# Patient Record
Sex: Female | Born: 1945 | Race: White | Hispanic: No | Marital: Married | Smoking: Never smoker
Health system: Southern US, Community
[De-identification: ages and names within clinical notes are randomized; demographics above are authoritative.]

## PROBLEM LIST (undated history)

## (undated) DIAGNOSIS — Z9889 Other specified postprocedural states: Secondary | ICD-10-CM

## (undated) DIAGNOSIS — R112 Nausea with vomiting, unspecified: Secondary | ICD-10-CM

## (undated) DIAGNOSIS — E785 Hyperlipidemia, unspecified: Secondary | ICD-10-CM

## (undated) DIAGNOSIS — I10 Essential (primary) hypertension: Secondary | ICD-10-CM

## (undated) DIAGNOSIS — K759 Inflammatory liver disease, unspecified: Secondary | ICD-10-CM

## (undated) DIAGNOSIS — Z5189 Encounter for other specified aftercare: Secondary | ICD-10-CM

## (undated) DIAGNOSIS — M1990 Unspecified osteoarthritis, unspecified site: Secondary | ICD-10-CM

## (undated) DIAGNOSIS — IMO0001 Reserved for inherently not codable concepts without codable children: Secondary | ICD-10-CM

## (undated) HISTORY — PX: KNEE ARTHROSCOPY: SUR90

## (undated) HISTORY — PX: COLONOSCOPY: SHX174

## (undated) HISTORY — PX: JOINT REPLACEMENT: SHX530

## (undated) HISTORY — PX: TONSILLECTOMY: SUR1361

## (undated) HISTORY — PX: RECTAL SURGERY: SHX760

---

## 1972-04-19 HISTORY — PX: APPENDECTOMY: SHX54

## 1993-04-19 HISTORY — PX: ABDOMINAL HYSTERECTOMY: SHX81

## 1994-04-19 HISTORY — PX: CHOLECYSTECTOMY: SHX55

## 2003-09-25 ENCOUNTER — Inpatient Hospital Stay (HOSPITAL_COMMUNITY): Admission: RE | Admit: 2003-09-25 | Discharge: 2003-09-29 | Payer: Self-pay | Admitting: Orthopedic Surgery

## 2004-08-19 ENCOUNTER — Inpatient Hospital Stay (HOSPITAL_COMMUNITY): Admission: RE | Admit: 2004-08-19 | Discharge: 2004-08-22 | Payer: Self-pay | Admitting: Orthopedic Surgery

## 2010-04-19 DIAGNOSIS — R413 Other amnesia: Secondary | ICD-10-CM

## 2010-04-19 HISTORY — DX: Other amnesia: R41.3

## 2011-05-21 ENCOUNTER — Other Ambulatory Visit (HOSPITAL_COMMUNITY): Payer: Self-pay | Admitting: Orthopaedic Surgery

## 2011-05-21 DIAGNOSIS — M25562 Pain in left knee: Secondary | ICD-10-CM

## 2011-06-01 ENCOUNTER — Encounter (HOSPITAL_COMMUNITY)
Admission: RE | Admit: 2011-06-01 | Discharge: 2011-06-01 | Disposition: A | Discharge status: Home / Self Care | Payer: BC Managed Care – PPO | Source: Ambulatory Visit | Attending: Orthopaedic Surgery | Admitting: Orthopaedic Surgery

## 2011-06-01 ENCOUNTER — Encounter (HOSPITAL_COMMUNITY): Payer: Self-pay

## 2011-06-01 DIAGNOSIS — M25562 Pain in left knee: Secondary | ICD-10-CM

## 2011-06-01 DIAGNOSIS — M25569 Pain in unspecified knee: Secondary | ICD-10-CM | POA: Insufficient documentation

## 2011-06-01 DIAGNOSIS — Z96659 Presence of unspecified artificial knee joint: Secondary | ICD-10-CM | POA: Insufficient documentation

## 2011-06-01 MED ORDER — TECHNETIUM TC 99M MEDRONATE IV KIT
25.0000 | PACK | Freq: Once | INTRAVENOUS | Status: AC | PRN
  Administered 2011-06-01: 25 via INTRAVENOUS

## 2011-06-08 ENCOUNTER — Encounter (HOSPITAL_COMMUNITY): Payer: Self-pay | Admitting: Pharmacy Technician

## 2011-06-11 ENCOUNTER — Encounter (HOSPITAL_COMMUNITY): Payer: Self-pay

## 2011-06-11 ENCOUNTER — Ambulatory Visit (HOSPITAL_COMMUNITY)
Admission: RE | Admit: 2011-06-11 | Discharge: 2011-06-11 | Disposition: A | Discharge status: Home / Self Care | Payer: BC Managed Care – PPO | Source: Ambulatory Visit | Attending: Orthopedic Surgery | Admitting: Orthopedic Surgery

## 2011-06-11 ENCOUNTER — Encounter (HOSPITAL_COMMUNITY)
Admission: RE | Admit: 2011-06-11 | Discharge: 2011-06-11 | Disposition: A | Discharge status: Home / Self Care | Payer: BC Managed Care – PPO | Source: Ambulatory Visit | Attending: Orthopaedic Surgery | Admitting: Orthopaedic Surgery

## 2011-06-11 ENCOUNTER — Other Ambulatory Visit: Payer: Self-pay

## 2011-06-11 DIAGNOSIS — Z01810 Encounter for preprocedural cardiovascular examination: Secondary | ICD-10-CM | POA: Insufficient documentation

## 2011-06-11 DIAGNOSIS — Z01812 Encounter for preprocedural laboratory examination: Secondary | ICD-10-CM | POA: Insufficient documentation

## 2011-06-11 DIAGNOSIS — E119 Type 2 diabetes mellitus without complications: Secondary | ICD-10-CM | POA: Insufficient documentation

## 2011-06-11 DIAGNOSIS — Z01818 Encounter for other preprocedural examination: Secondary | ICD-10-CM | POA: Insufficient documentation

## 2011-06-11 DIAGNOSIS — I10 Essential (primary) hypertension: Secondary | ICD-10-CM | POA: Insufficient documentation

## 2011-06-11 HISTORY — DX: Nausea with vomiting, unspecified: R11.2

## 2011-06-11 HISTORY — DX: Other specified postprocedural states: Z98.890

## 2011-06-11 HISTORY — DX: Essential (primary) hypertension: I10

## 2011-06-11 HISTORY — DX: Hyperlipidemia, unspecified: E78.5

## 2011-06-11 LAB — URINE MICROSCOPIC-ADD ON

## 2011-06-11 LAB — DIFFERENTIAL
Basophils Absolute: 0 10*3/uL (ref 0.0–0.1)
Lymphocytes Relative: 18 % (ref 12–46)
Lymphs Abs: 1.2 10*3/uL (ref 0.7–4.0)
Monocytes Absolute: 0.6 10*3/uL (ref 0.1–1.0)
Neutro Abs: 5 10*3/uL (ref 1.7–7.7)

## 2011-06-11 LAB — URINALYSIS, ROUTINE W REFLEX MICROSCOPIC
Glucose, UA: NEGATIVE mg/dL
Hgb urine dipstick: NEGATIVE
Ketones, ur: NEGATIVE mg/dL
Protein, ur: NEGATIVE mg/dL

## 2011-06-11 LAB — CBC
MCH: 32 pg (ref 26.0–34.0)
MCHC: 34.4 g/dL (ref 30.0–36.0)
MCV: 93 fL (ref 78.0–100.0)
Platelets: 254 10*3/uL (ref 150–400)
RBC: 3.59 MIL/uL — ABNORMAL LOW (ref 3.87–5.11)
RDW: 13.6 % (ref 11.5–15.5)

## 2011-06-11 LAB — COMPREHENSIVE METABOLIC PANEL
AST: 21 U/L (ref 0–37)
CO2: 29 mEq/L (ref 19–32)
Calcium: 9.8 mg/dL (ref 8.4–10.5)
Creatinine, Ser: 0.9 mg/dL (ref 0.50–1.10)
GFR calc non Af Amer: 65 mL/min — ABNORMAL LOW (ref >90)
Sodium: 138 mEq/L (ref 135–145)
Total Protein: 7.6 g/dL (ref 6.0–8.3)

## 2011-06-11 LAB — PROTIME-INR: Prothrombin Time: 14.3 seconds (ref 11.6–15.2)

## 2011-06-11 MED ORDER — CHLORHEXIDINE GLUCONATE 4 % EX LIQD: 60.0000 mL | Freq: Every day | CUTANEOUS | Status: DC

## 2011-06-11 MED ORDER — CHLORHEXIDINE GLUCONATE 4 % EX LIQD: 60.0000 mL | Freq: Once | CUTANEOUS | Status: DC

## 2011-06-11 NOTE — Pre-Procedure Instructions (Signed)
20 Mabel Roll  06/11/2011   Your procedure is scheduled on:  June 22, 2011 (Tuesday)  Report to [facility] at 5:30 AM.  Call this number if you have problems the morning of surgery: [phone]   Remember:   Do not eat food:After Midnight.  May have clear liquids: up to 4 Hours before arrival.  Clear liquids include soda, tea, black coffee, apple or grape juice, broth.  Take these medicines the morning of surgery with A SIP OF WATER: bystolic, ultram   Do not wear jewelry, make-up or nail polish.  Do not wear lotions, powders, or perfumes. You may wear deodorant.  Do not shave 48 hours prior to surgery.  Do not bring valuables to the hospital.  Contacts, dentures or bridgework may not be worn into surgery.  Leave suitcase in the car. After surgery it may be brought to your room.  For patients admitted to the hospital, checkout time is 11:00 AM the day of discharge.   Patients discharged the day of surgery will not be allowed to drive home.  Name and phone number of your driver: NA  Special Instructions: Incentive Spirometry - Practice and bring it with you on the day of surgery. and CHG Shower Use Special Wash: 1/2 bottle night before surgery and 1/2 bottle morning of surgery.   Please read over the following fact sheets that you were given: Pain Booklet, Blood Transfusion Information, Total Joint Packet, MRSA Information and Surgical Site Infection Prevention

## 2011-06-12 LAB — URINE CULTURE
Colony Count: NO GROWTH
Culture: NO GROWTH

## 2011-06-14 NOTE — Progress Notes (Signed)
Anesthesia  C-met noted Alk Phosphatase 145. OK to proceed.  Kipp Brood

## 2011-06-14 NOTE — Progress Notes (Signed)
Elevated ALK Phos on CMET- will make anesth. Aware.

## 2011-06-21 MED ORDER — SODIUM CHLORIDE 0.9 % IV SOLN: INTRAVENOUS | Status: DC

## 2011-06-21 NOTE — H&P (Signed)
[name] MRN:  [id number] DOB/SEX:  22-Sep-1945/female  CHIEF COMPLAINT:  Painful left Knee  HISTORY: [name] is a very pleasant 66 year old white female who is seen back today for follow up of her left knee pain.  She had a left total knee arthroplasty performed by Dr. Priscille Kluver in 2005.  She states she has had pain off and on in the left knee since her surgery, but this began to exacerbate in January of this year.  She has gotten to the point now where her pain is so severe and sharp that she is having difficulties tolerating the pain.  It was of insidious onset recently such that caused her even to leave work.  She went home and rested the knee and finally had improvement initially.  When she is up and active the pain worsens.  When she is at rest the pain is tolerable.  She denies any recent history of injury or trauma to the knee.  She has been using Advil intermittently throughout the day.  She denies any fevers, shakes, or chills.  Rest has been helpful, but activity again worsens her symptoms.  We have sent her off for a bone scan as well as laboratory studies and an aspiration was performed and revealed:  LABS:    1. Laboratory studies of the blood reveals a white count of 7,400.  She does have some anemia with hemoglobin of 11.2.  Sedimentation rate was 30.  CRP was .74.  2. Aspiration revealed yellow, cloudy fluid with 660 white cells, 38 neutrophils, 54 lymphocytes, 7 monocytes, 1 eosinophil.  There were no crystals noted.  Glucose was 82.  Protein was 4.1.    3. Cultures revealed no growth x 3 days.    BONE SCAN:  Three phase bone scan reveals asymmetric increased uptake around the left knee prosthesis that could be due to infection or loosening.  This was around all three components.   She has failed conservative treatment and because of worsening pain is indicated for Removal of prosthesis and revision vs antibiotic spacer implantation staging.   PAST MEDICAL HISTORY: There are no  active problems to display for this patient.  Past Medical History  Diagnosis Date  . PONV (postoperative nausea and vomiting)     scop patch before prior surgeries  . Hypertension   . Hyperlipemia    Past Surgical History  Procedure Date  . Appendectomy 1974  . Abdominal hysterectomy 1995  . Cholecystectomy 1996  . Joint replacement     lft knee 2005, rt knee 2006  . Colonoscopy      MEDICATIONS:   No prescriptions prior to admission    ALLERGIES:  No Known Allergies  REVIEW OF SYSTEMS:  A comprehensive review of systems was negative.   FAMILY HISTORY:  No family history on file.  SOCIAL HISTORY:   History  Substance Use Topics  . Smoking status: Never Smoker   . Smokeless tobacco: Not on file  . Alcohol Use: No     EXAMINATION:  Vital signs in last 24 hours:    T - 98.2,  P-63,  R-18,  BP- 132/85, HT-5'6"  WT 200  BMI- 32.3  General Appearance:    Alert, cooperative, no distress, appears stated age  Head:    Normocephalic, without obvious abnormality, atraumatic  Eyes:    PERRL, conjunctiva/corneas clear  Ears:    Normal TM's and external ear canals, both ears  Nose:   Nares normal, septum midline, mucosa normal, no drainage  or sinus tenderness  Throat:   Lips, mucosa, and tongue normal; teeth and gums normal  Neck:   Supple, symmetrical, trachea midline  Back:     Symmetric, no curvature, ROM normal, no CVA tenderness  Lungs:     Clear to auscultation bilaterally, respirations unlabored  Chest Wall:    No tenderness or deformity   Heart:    Regular rate and rhythm, S1 and S2 normal, no murmur, rub   or gallop  Breast Exam:    Not indicated for Orthopaedic exam  Abdomen:     Soft, non-tender, bowel sounds active all four quadrants,    no masses, no organomegaly  Genitalia:    Not indicated for Orthopaedic exam  Rectal:    Not indicated for Orthopaedic exam   Extremities:   Extremities normal, atraumatic, no cyanosis or edema  Pulses:   2+ and  symmetric all extremities  Skin:   Skin color, texture, turgor normal, no rashes or lesions  Lymph nodes:   Cervical, supraclavicular, and axillary nodes normal  Neurologic:   CNII-XII intact grossly intact, Ox3       MUSCULOSKELETAL:  she has range of motion from 10 degrees to 90 degrees.  She appears to                                               have good stability at this time.  There is no warmth or erythema.  She                                                     does have a mild effusion noted.  She has good motion of the hips                                                           bilaterally.  She is neurovascularly intact distally.  A well-healed surgical                                                   incision is noted.      Imaging Review         See HPI   Assessment/Plan:  Painful Left Total Knee Replacement    Past Medical History  Diagnosis Date  . PONV (postoperative nausea and vomiting)     scop patch before prior surgeries  . Hypertension   . Hyperlipemia     Plan for left total knee revision vs antibiotic space  The patient has failed conservative treatment.  The clearance notes were reviewed.  After discussion with the patient it was felt that RevisionTotal Knee Replacement vs Antibiotic Spacer was indicated. The procedure,  risks, and benefits of Revision total knee arthroplasty were presented and reviewed. The risks including but not limited to aseptic loosening, infection, blood clots, vascular injury, stiffness, patella tracking problems and fracture complications among others  were discussed. The patient acknowledged the explanation, agreed to proceed with the plan.  [name] 06/21/2011, 6:24 PM

## 2011-06-22 ENCOUNTER — Encounter (HOSPITAL_COMMUNITY): Payer: Self-pay | Admitting: Anesthesiology

## 2011-06-22 ENCOUNTER — Encounter (HOSPITAL_COMMUNITY)
Admission: RE | Disposition: A | Discharge status: Home / Self Care | Payer: Self-pay | Source: Ambulatory Visit | Attending: Orthopaedic Surgery

## 2011-06-22 ENCOUNTER — Encounter (HOSPITAL_COMMUNITY): Payer: Self-pay | Admitting: *Deleted

## 2011-06-22 ENCOUNTER — Ambulatory Visit (HOSPITAL_COMMUNITY): Payer: BC Managed Care – PPO | Admitting: Anesthesiology

## 2011-06-22 DIAGNOSIS — I10 Essential (primary) hypertension: Secondary | ICD-10-CM | POA: Diagnosis present

## 2011-06-22 DIAGNOSIS — R112 Nausea with vomiting, unspecified: Secondary | ICD-10-CM | POA: Diagnosis not present

## 2011-06-22 DIAGNOSIS — Z96659 Presence of unspecified artificial knee joint: Secondary | ICD-10-CM

## 2011-06-22 DIAGNOSIS — E669 Obesity, unspecified: Secondary | ICD-10-CM | POA: Diagnosis present

## 2011-06-22 DIAGNOSIS — Z01812 Encounter for preprocedural laboratory examination: Secondary | ICD-10-CM

## 2011-06-22 DIAGNOSIS — T8450XA Infection and inflammatory reaction due to unspecified internal joint prosthesis, initial encounter: Principal | ICD-10-CM | POA: Diagnosis present

## 2011-06-22 DIAGNOSIS — D649 Anemia, unspecified: Secondary | ICD-10-CM | POA: Diagnosis present

## 2011-06-22 DIAGNOSIS — Z90710 Acquired absence of both cervix and uterus: Secondary | ICD-10-CM

## 2011-06-22 DIAGNOSIS — D62 Acute posthemorrhagic anemia: Secondary | ICD-10-CM | POA: Diagnosis not present

## 2011-06-22 DIAGNOSIS — E785 Hyperlipidemia, unspecified: Secondary | ICD-10-CM | POA: Diagnosis present

## 2011-06-22 DIAGNOSIS — D638 Anemia in other chronic diseases classified elsewhere: Secondary | ICD-10-CM | POA: Diagnosis present

## 2011-06-22 DIAGNOSIS — Z6831 Body mass index (BMI) 31.0-31.9, adult: Secondary | ICD-10-CM

## 2011-06-22 DIAGNOSIS — T84099A Other mechanical complication of unspecified internal joint prosthesis, initial encounter: Secondary | ICD-10-CM | POA: Diagnosis present

## 2011-06-22 DIAGNOSIS — T8484XA Pain due to internal orthopedic prosthetic devices, implants and grafts, initial encounter: Secondary | ICD-10-CM

## 2011-06-22 LAB — GRAM STAIN

## 2011-06-22 SURGERY — REMOVAL, TOTAL ARTHROPLASTY HARDWARE, KNEE, WITH ANTIBIOTIC SPACER INSERTION
Anesthesia: General | Site: Knee | Laterality: Left | Wound class: Clean

## 2011-06-22 MED ORDER — FOLIC ACID 1 MG PO TABS
1.0000 mg | ORAL_TABLET | Freq: Every day | ORAL | Status: DC
  Administered 2011-06-22 – 2011-06-24 (×3): 1 mg via ORAL
  Filled 2011-06-22 (×4): qty 1

## 2011-06-22 MED ORDER — METHOCARBAMOL 100 MG/ML IJ SOLN
500.0000 mg | INTRAVENOUS | Status: AC
  Administered 2011-06-22: 500 mg via INTRAVENOUS
  Filled 2011-06-22: qty 5

## 2011-06-22 MED ORDER — HYDROMORPHONE HCL PF 1 MG/ML IJ SOLN
0.2500 mg | INTRAMUSCULAR | Status: DC | PRN
  Administered 2011-06-22 (×2): 0.5 mg via INTRAVENOUS

## 2011-06-22 MED ORDER — DOCUSATE SODIUM 100 MG PO CAPS
100.0000 mg | ORAL_CAPSULE | Freq: Two times a day (BID) | ORAL | Status: DC
  Administered 2011-06-22 – 2011-06-24 (×5): 100 mg via ORAL
  Filled 2011-06-22 (×7): qty 1

## 2011-06-22 MED ORDER — MINERAL OIL LIGHT 100 % EX OIL
TOPICAL_OIL | CUTANEOUS | Status: DC | PRN
  Administered 2011-06-22: 1 via TOPICAL

## 2011-06-22 MED ORDER — NEBIVOLOL HCL 10 MG PO TABS
10.0000 mg | ORAL_TABLET | Freq: Every day | ORAL | Status: DC
  Administered 2011-06-23 – 2011-06-24 (×2): 10 mg via ORAL
  Filled 2011-06-22 (×3): qty 1

## 2011-06-22 MED ORDER — METOCLOPRAMIDE HCL 5 MG/ML IJ SOLN: 5.0000 mg | Freq: Three times a day (TID) | INTRAMUSCULAR | Status: DC | PRN

## 2011-06-22 MED ORDER — ONDANSETRON HCL 4 MG/2ML IJ SOLN
4.0000 mg | Freq: Four times a day (QID) | INTRAMUSCULAR | Status: DC | PRN
  Administered 2011-06-25: 4 mg via INTRAVENOUS
  Filled 2011-06-22: qty 2

## 2011-06-22 MED ORDER — ONDANSETRON HCL 4 MG/2ML IJ SOLN: 4.0000 mg | Freq: Four times a day (QID) | INTRAMUSCULAR | Status: DC | PRN

## 2011-06-22 MED ORDER — RIVAROXABAN 10 MG PO TABS
10.0000 mg | ORAL_TABLET | ORAL | Status: DC
  Administered 2011-06-22 – 2011-06-24 (×3): 10 mg via ORAL
  Filled 2011-06-22 (×4): qty 1

## 2011-06-22 MED ORDER — NIACIN ER 250 MG PO CPCR
250.0000 mg | ORAL_CAPSULE | Freq: Every day | ORAL | Status: DC
  Administered 2011-06-22 – 2011-06-24 (×3): 250 mg via ORAL
  Filled 2011-06-22 (×5): qty 1

## 2011-06-22 MED ORDER — HYDROMORPHONE 0.3 MG/ML IV SOLN
INTRAVENOUS | Status: DC
  Administered 2011-06-22 (×2): 0.2 mg via INTRAVENOUS
  Administered 2011-06-22: 0.1 mg via INTRAVENOUS
  Administered 2011-06-23: 0.2 mg via INTRAVENOUS
  Administered 2011-06-23: 0.1 mg via INTRAVENOUS

## 2011-06-22 MED ORDER — METOCLOPRAMIDE HCL 10 MG PO TABS: 5.0000 mg | ORAL_TABLET | Freq: Three times a day (TID) | ORAL | Status: DC | PRN

## 2011-06-22 MED ORDER — SODIUM CHLORIDE 0.9 % IV SOLN
INTRAVENOUS | Status: DC
  Administered 2011-06-22: 14:00:00 via INTRAVENOUS

## 2011-06-22 MED ORDER — CEFAZOLIN SODIUM 1-5 GM-% IV SOLN
INTRAVENOUS | Status: AC
  Filled 2011-06-22: qty 50

## 2011-06-22 MED ORDER — SODIUM CHLORIDE 0.9 % IJ SOLN: 9.0000 mL | INTRAMUSCULAR | Status: DC | PRN

## 2011-06-22 MED ORDER — MENTHOL 3 MG MT LOZG: 1.0000 | LOZENGE | OROMUCOSAL | Status: DC | PRN

## 2011-06-22 MED ORDER — LISINOPRIL 10 MG PO TABS
10.0000 mg | ORAL_TABLET | Freq: Every day | ORAL | Status: DC
  Administered 2011-06-22 – 2011-06-24 (×3): 10 mg via ORAL
  Filled 2011-06-22 (×4): qty 1

## 2011-06-22 MED ORDER — ONDANSETRON HCL 4 MG/2ML IJ SOLN
INTRAMUSCULAR | Status: DC | PRN
  Administered 2011-06-22: 4 mg via INTRAVENOUS

## 2011-06-22 MED ORDER — HYDROMORPHONE HCL PF 1 MG/ML IJ SOLN
INTRAMUSCULAR | Status: AC
  Filled 2011-06-22: qty 1

## 2011-06-22 MED ORDER — ACETAMINOPHEN 10 MG/ML IV SOLN
INTRAVENOUS | Status: DC | PRN
  Administered 2011-06-22: 1000 mg via INTRAVENOUS

## 2011-06-22 MED ORDER — SODIUM CHLORIDE 0.9 % IR SOLN
Status: DC | PRN
  Administered 2011-06-22: 1000 mL

## 2011-06-22 MED ORDER — CEFAZOLIN SODIUM 1-5 GM-% IV SOLN
INTRAVENOUS | Status: DC | PRN
  Administered 2011-06-22: 2 g via INTRAVENOUS

## 2011-06-22 MED ORDER — PROPOFOL 10 MG/ML IV EMUL
INTRAVENOUS | Status: DC | PRN
  Administered 2011-06-22: 200 mg via INTRAVENOUS

## 2011-06-22 MED ORDER — ACETAMINOPHEN 10 MG/ML IV SOLN
1000.0000 mg | Freq: Four times a day (QID) | INTRAVENOUS | Status: AC
  Administered 2011-06-22 – 2011-06-23 (×4): 1000 mg via INTRAVENOUS
  Filled 2011-06-22 (×4): qty 100

## 2011-06-22 MED ORDER — PHENOL 1.4 % MT LIQD: 1.0000 | OROMUCOSAL | Status: DC | PRN

## 2011-06-22 MED ORDER — VANCOMYCIN HCL 1000 MG IV SOLR
4000.0000 mg | INTRAVENOUS | Status: DC
  Filled 2011-06-22: qty 4000

## 2011-06-22 MED ORDER — FENTANYL CITRATE 0.05 MG/ML IJ SOLN
INTRAMUSCULAR | Status: DC | PRN
  Administered 2011-06-22: 50 ug via INTRAVENOUS
  Administered 2011-06-22: 25 ug via INTRAVENOUS
  Administered 2011-06-22: 100 ug via INTRAVENOUS
  Administered 2011-06-22: 50 ug via INTRAVENOUS

## 2011-06-22 MED ORDER — ACETAMINOPHEN 10 MG/ML IV SOLN
INTRAVENOUS | Status: AC
  Filled 2011-06-22: qty 100

## 2011-06-22 MED ORDER — HYDROMORPHONE 0.3 MG/ML IV SOLN
INTRAVENOUS | Status: AC
  Filled 2011-06-22: qty 25

## 2011-06-22 MED ORDER — METHOCARBAMOL 500 MG PO TABS
500.0000 mg | ORAL_TABLET | Freq: Four times a day (QID) | ORAL | Status: DC | PRN
  Administered 2011-06-24 (×3): 500 mg via ORAL
  Filled 2011-06-22 (×2): qty 1

## 2011-06-22 MED ORDER — DIPHENHYDRAMINE HCL 12.5 MG/5ML PO ELIX: 12.5000 mg | ORAL_SOLUTION | Freq: Four times a day (QID) | ORAL | Status: DC | PRN

## 2011-06-22 MED ORDER — TOBRAMYCIN SULFATE 1.2 G IJ SOLR
INTRAMUSCULAR | Status: DC | PRN
  Administered 2011-06-22: 4.8 g

## 2011-06-22 MED ORDER — ZOLPIDEM TARTRATE 5 MG PO TABS: 5.0000 mg | ORAL_TABLET | Freq: Every evening | ORAL | Status: DC | PRN

## 2011-06-22 MED ORDER — ACETAMINOPHEN 10 MG/ML IV SOLN: 1000.0000 mg | Freq: Once | INTRAVENOUS | Status: DC

## 2011-06-22 MED ORDER — BISACODYL 10 MG RE SUPP: 10.0000 mg | Freq: Every day | RECTAL | Status: DC | PRN

## 2011-06-22 MED ORDER — OXYCODONE HCL 5 MG PO TABS
5.0000 mg | ORAL_TABLET | ORAL | Status: DC | PRN
  Administered 2011-06-23 – 2011-06-25 (×9): 10 mg via ORAL
  Filled 2011-06-22 (×9): qty 2

## 2011-06-22 MED ORDER — LIDOCAINE HCL (CARDIAC) 20 MG/ML IV SOLN
INTRAVENOUS | Status: DC | PRN
  Administered 2011-06-22: 75 mg via INTRAVENOUS

## 2011-06-22 MED ORDER — BUPIVACAINE-EPINEPHRINE PF 0.25-1:200000 % IJ SOLN
INTRAMUSCULAR | Status: DC | PRN
  Administered 2011-06-22: 30 mL

## 2011-06-22 MED ORDER — BUPIVACAINE-EPINEPHRINE PF 0.5-1:200000 % IJ SOLN
INTRAMUSCULAR | Status: DC | PRN
  Administered 2011-06-22: 20 mL

## 2011-06-22 MED ORDER — KETOROLAC TROMETHAMINE 15 MG/ML IJ SOLN
15.0000 mg | Freq: Four times a day (QID) | INTRAMUSCULAR | Status: DC
  Administered 2011-06-22: 15 mg via INTRAVENOUS

## 2011-06-22 MED ORDER — LACTATED RINGERS IV SOLN
INTRAVENOUS | Status: DC | PRN
  Administered 2011-06-22 (×2): via INTRAVENOUS

## 2011-06-22 MED ORDER — ONDANSETRON HCL 4 MG PO TABS: 4.0000 mg | ORAL_TABLET | Freq: Four times a day (QID) | ORAL | Status: DC | PRN

## 2011-06-22 MED ORDER — DIPHENHYDRAMINE HCL 50 MG/ML IJ SOLN: 12.5000 mg | Freq: Four times a day (QID) | INTRAMUSCULAR | Status: DC | PRN

## 2011-06-22 MED ORDER — METHOCARBAMOL 100 MG/ML IJ SOLN: 500.0000 mg | Freq: Four times a day (QID) | INTRAVENOUS | Status: DC | PRN

## 2011-06-22 MED ORDER — NALOXONE HCL 0.4 MG/ML IJ SOLN: 0.4000 mg | INTRAMUSCULAR | Status: DC | PRN

## 2011-06-22 MED ORDER — VANCOMYCIN HCL 1000 MG IV SOLR
INTRAVENOUS | Status: DC | PRN
  Administered 2011-06-22: 4 g

## 2011-06-22 MED ORDER — NIACIN ER 250 MG PO TBCR: 250.0000 mg | EXTENDED_RELEASE_TABLET | Freq: Every day | ORAL | Status: DC

## 2011-06-22 MED ORDER — FLEET ENEMA 7-19 GM/118ML RE ENEM: 1.0000 | ENEMA | Freq: Once | RECTAL | Status: AC | PRN

## 2011-06-22 MED ORDER — ONDANSETRON HCL 4 MG/2ML IJ SOLN: 4.0000 mg | Freq: Once | INTRAMUSCULAR | Status: DC | PRN

## 2011-06-22 SURGICAL SUPPLY — 56 items
BANDAGE ESMARK 6X9 LF (GAUZE/BANDAGES/DRESSINGS) ×2 IMPLANT
BLADE SAGITTAL 25.0X1.19X90 (BLADE) ×3 IMPLANT
BLADE SAW SGTL 81X20 HD (BLADE) ×3 IMPLANT
BLADE SURG 10 STRL SS (BLADE) ×12 IMPLANT
BNDG ESMARK 6X9 LF (GAUZE/BANDAGES/DRESSINGS) ×3
BOWL SMART MIX CTS (DISPOSABLE) ×6 IMPLANT
CEMENT HV SMART SET (Cement) ×12 IMPLANT
CLOTH BEACON ORANGE TIMEOUT ST (SAFETY) ×3 IMPLANT
COVER BACK TABLE 24X17X13 BIG (DRAPES) ×3 IMPLANT
COVER SURGICAL LIGHT HANDLE (MISCELLANEOUS) ×3 IMPLANT
CUFF TOURNIQUET SINGLE 34IN LL (TOURNIQUET CUFF) ×3 IMPLANT
CUFF TOURNIQUET SINGLE 44IN (TOURNIQUET CUFF) IMPLANT
DRAPE EXTREMITY T 121X128X90 (DRAPE) ×3 IMPLANT
DRAPE TABLE COVER HEAVY DUTY (DRAPES) ×3 IMPLANT
DRSG ADAPTIC 3X8 NADH LF (GAUZE/BANDAGES/DRESSINGS) ×3 IMPLANT
DRSG PAD ABDOMINAL 8X10 ST (GAUZE/BANDAGES/DRESSINGS) ×3 IMPLANT
DURAPREP 26ML APPLICATOR (WOUND CARE) ×3 IMPLANT
ELECT REM PT RETURN 9FT ADLT (ELECTROSURGICAL) ×3
ELECTRODE REM PT RTRN 9FT ADLT (ELECTROSURGICAL) ×2 IMPLANT
EVACUATOR 1/8 PVC DRAIN (DRAIN) IMPLANT
FACESHIELD LNG OPTICON STERILE (SAFETY) ×6 IMPLANT
GLOVE BIOGEL PI IND STRL 8 (GLOVE) ×2 IMPLANT
GLOVE BIOGEL PI IND STRL 8.5 (GLOVE) ×2 IMPLANT
GLOVE BIOGEL PI INDICATOR 8 (GLOVE) ×1
GLOVE BIOGEL PI INDICATOR 8.5 (GLOVE) ×1
GLOVE ECLIPSE 8.0 STRL XLNG CF (GLOVE) ×12 IMPLANT
GOWN PREVENTION PLUS XLARGE (GOWN DISPOSABLE) ×6 IMPLANT
GOWN STRL NON-REIN LRG LVL3 (GOWN DISPOSABLE) ×3 IMPLANT
HANDPIECE INTERPULSE COAX TIP (DISPOSABLE) ×1
INSERT TIB LCS RP STD 12.5 (Knees) ×3 IMPLANT
KIT BASIN OR (CUSTOM PROCEDURE TRAY) ×3 IMPLANT
KIT ROOM TURNOVER OR (KITS) ×3 IMPLANT
MANIFOLD NEPTUNE II (INSTRUMENTS) ×3 IMPLANT
NEEDLE 22X1 1/2 (OR ONLY) (NEEDLE) IMPLANT
NS IRRIG 1000ML POUR BTL (IV SOLUTION) ×3 IMPLANT
PACK TOTAL JOINT (CUSTOM PROCEDURE TRAY) ×3 IMPLANT
PAD ARMBOARD 7.5X6 YLW CONV (MISCELLANEOUS) ×6 IMPLANT
PAD CAST 4YDX4 CTTN HI CHSV (CAST SUPPLIES) ×2 IMPLANT
PADDING CAST COTTON 4X4 STRL (CAST SUPPLIES) ×1
SET HNDPC FAN SPRY TIP SCT (DISPOSABLE) ×2 IMPLANT
SPONGE GAUZE 4X4 12PLY (GAUZE/BANDAGES/DRESSINGS) ×3 IMPLANT
SPONGE GAUZE 4X4 STERILE 39 (GAUZE/BANDAGES/DRESSINGS) ×3 IMPLANT
SPONGE LAP 18X18 X RAY DECT (DISPOSABLE) ×3 IMPLANT
STAPLER VISISTAT 35W (STAPLE) ×3 IMPLANT
SUCTION FRAZIER TIP 10 FR DISP (SUCTIONS) ×3 IMPLANT
SUT BONE WAX W31G (SUTURE) ×3 IMPLANT
SUT ETHIBOND NAB CT1 #1 30IN (SUTURE) ×12 IMPLANT
SUT VIC AB 0 CT1 27 (SUTURE) ×1
SUT VIC AB 0 CT1 27XBRD ANBCTR (SUTURE) ×2 IMPLANT
SUT VIC AB 2-0 FS1 27 (SUTURE) ×6 IMPLANT
SWAB COLLECTION DEVICE MRSA (MISCELLANEOUS) ×3 IMPLANT
SYR 20CC LL (SYRINGE) ×3 IMPLANT
SYR CONTROL 10ML LL (SYRINGE) IMPLANT
TRAY FOLEY CATH 14FR (SET/KITS/TRAYS/PACK) ×3 IMPLANT
WATER STERILE IRR 1000ML POUR (IV SOLUTION) IMPLANT
WRAP KNEE MAXI GEL POST OP (GAUZE/BANDAGES/DRESSINGS) ×3 IMPLANT

## 2011-06-22 NOTE — Anesthesia Procedure Notes (Addendum)
Anesthesia Regional Block:  Femoral nerve block  Pre-Anesthetic Checklist: ,, timeout performed, Correct Patient, Correct Site, Correct Laterality, Correct Procedure, Correct Position, site marked, Risks and benefits discussed,  Surgical consent,  Pre-op evaluation,  At surgeon's request and post-op pain management  Laterality: Left  Prep: Maximum Sterile Barrier Precautions used and chloraprep       Needles:  Injection technique: Single-shot  Needle Type: Stimulator Needle - 80          Additional Needles:  Procedures: nerve stimulator Femoral nerve block  Nerve Stimulator or Paresthesia:  Response: 0.5 mA, 0.1 ms, 5 cm  Additional Responses:   Narrative:  Start time: 06/22/2011 7:00 AM End time: 06/22/2011 7:05 AM Injection made incrementally with aspirations every 5 mL.  Performed by: Personally  Anesthesiologist: Maren Beach MD  Additional Notes: 20cc 0.5% Marcaine w/ epi  W/o difficulty or discomfort.   Procedure Name: LMA Insertion Date/Time: 06/22/2011 7:47 AM Performed by: Gwenyth Allegra Pre-anesthesia Checklist: Patient identified, Timeout performed, Emergency Drugs available, Suction available and Patient being monitored Patient Re-evaluated:Patient Re-evaluated prior to inductionOxygen Delivery Method: Circle system utilized Preoxygenation: Pre-oxygenation with 100% oxygen Intubation Type: IV induction LMA: LMA inserted LMA Size: 4.0 Number of attempts: 2 Placement Confirmation: positive ETCO2 and breath sounds checked- equal and bilateral Tube secured with: Tape Dental Injury: Teeth and Oropharynx as per pre-operative assessment

## 2011-06-22 NOTE — Anesthesia Preprocedure Evaluation (Signed)
Anesthesia Evaluation  Patient identified by MRN, date of birth, ID band Patient awake    Reviewed: Allergy & Precautions, H&P , NPO status , Patient's Chart, lab work & pertinent test results  History of Anesthesia Complications (+) AWARENESS UNDER ANESTHESIA  Airway Mallampati: I TM Distance: >3 FB Neck ROM: full    Dental  (+) Teeth Intact   Pulmonary          Cardiovascular Rhythm:regular Rate:Normal     Neuro/Psych    GI/Hepatic   Endo/Other    Renal/GU      Musculoskeletal   Abdominal   Peds  Hematology   Anesthesia Other Findings   Reproductive/Obstetrics                           Anesthesia Physical Anesthesia Plan  ASA: I  Anesthesia Plan: General ETT, General LMA and General   Post-op Pain Management:    Induction: Intravenous  Airway Management Planned: Oral ETT and LMA  Additional Equipment:   Intra-op Plan:   Post-operative Plan: Extubation in OR  Informed Consent: I have reviewed the patients History and Physical, chart, labs and discussed the procedure including the risks, benefits and alternatives for the proposed anesthesia with the patient or authorized representative who has indicated his/her understanding and acceptance.     Plan Discussed with: Anesthesiologist, CRNA and Surgeon  Anesthesia Plan Comments:         Anesthesia Quick Evaluation

## 2011-06-22 NOTE — Preoperative (Signed)
Beta Blockers   Reason not to administer Beta Blockers:Not Applicable 

## 2011-06-22 NOTE — Progress Notes (Signed)
Orthopedic Tech Progress Note Patient Details:  [name] 27-Sep-1945 [id number]  CPM Left Knee CPM Left Knee: On Left Knee Flexion (Degrees): 40  Left Knee Extension (Degrees): 0    [name] 06/22/2011, 12:42 PM

## 2011-06-22 NOTE — Op Note (Signed)
NAMESAIGE, CANTON                 ACCOUNT NO.:  [id number]  MEDICAL RECORD NO.:  [id number]  LOCATION:  5038                         FACILITY:  [facility]  PHYSICIAN:  Claude Manges. 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  PROCEDURE:  Exploration of left total knee replacement, removal of components and insertion of antibiotic spacer with synovial biopsy.  SURGEON:  Claude Manges. Cleophas Dunker, M.D.  ASSISTANT:  Arlys John D. Petrarca, PA-C.  ANESTHESIA:  General.  COMPLICATIONS:  None.  PROCEDURE:  [name] was met in the holding area, identified the left knee as the appropriate operative site.  She was then transported to room number 1 and placed under general anesthesia without difficulty. Nursing staff inserted a Foley catheter.  The thigh tourniquet was applied to the left lower extremity.  The leg was then prepped with Betadine scrub and DuraPrep from the tourniquet to the midfoot.  Sterile draping was performed.  With the extremity still elevated, Esmarch exsanguinated with a proximal tourniquet at 350 mmHg.  The previous midline longitudinal incision was utilized and via sharp dissection,  incision carried down to subcutaneous tissue.  The soft tissue was then elevated from the deep capsule.  The capsule was identified by previously inserted Ethibond sutures.  These were incised along length of the skin incision, then the foreign body sutures were removed.  The joint was entered.  There was a slightly cloudy-yellow joint fluid.  Specimens were sent for culture and sensitivity.  The patella with some difficulty was everted to 180 degrees and the knee flexed to 90 degrees.  There was a  moderate amount of synovitis.  There appeared to be some white speckles within the synovitis either consistent with chondrocalcinosis or possibly pieces of shredded methacrylate.  It appeared that femur was loose, using the osteotomes, it was fairly easily removed.  There was a fair amount of erosion within the deep bone with a lot of beefy-red discoloring of the femur.  Deep specimens of synovium were removed and sent to the Pathology.  They revealed less than 5 white cells per high-powered field.  I then removed the polyethylene bearing and then the tibial bearing and removed all of the polymethyl methacrylate from both the femur and the tibia.  The bone appeared to be soft and there was no evidence of obvious infection based on the deep synovial specimens, but I was concerned about the possibility of infection based on the appearance and I elected to proceed with an antibiotic spacer.  The wound was then copiously irrigated with saline solution.  I did not see any visible methacrylate remaining in the femur or the tibia.  The patella was then also removed without difficulty and it was irrigated.  I then proceeded to insert an antibiotic spacer.  I used the DePuy cement with tobramycin and vancomycin.  I used the femoral mold, placed it over the femur and used a similar methacrylate  in the tibia and then used a 12.5 mm rotating bridging-bearing and placed into the methacrylate on the tibia.  I thought it had excellent stability.  Each batch of cement took about 16 minutes to mature and  extraneous methacrylate was removed to enhance motion.  The wound was then irrigated.  There was no any further extraneous methacrylate.  We injected 0.25% Marcaine with epinephrine in the deep capsule.  At 2 hours and 4 minutes, the tourniquet was deflated.  Gross bleeders were Bovie coagulated.  A  Hemovac was inserted.  The deep capsule was closed with interrupted #1 Ethibond, superficial capsule  with 0 and 2-0 Vicryl and 3-0 Monocryl.  Skin was closed with skin clips.  A sterile bulky dressing was applied followed by the patient's support stocking.  The patient tolerated the procedure well without complications.     Claude Manges. Cleophas Dunker, M.D.     PWW/MEDQ  D:  06/22/2011  T:  06/22/2011  Job:  [id number]

## 2011-06-22 NOTE — Transfer of Care (Signed)
Immediate Anesthesia Transfer of Care Note  Patient: [name]  Procedure(s) Performed: Procedure(s) (LRB): EXCISIONAL TOTAL KNEE ARTHROPLASTY WITH ANTIBIOTIC SPACERS (Left)  Patient Location: PACU  Anesthesia Type: General and GA combined with regional for post-op pain  Level of Consciousness: sedated  Airway & Oxygen Therapy: Patient Spontanous Breathing and Patient connected to nasal cannula oxygen  Post-op Assessment: Report given to PACU RN and Post -op Vital signs reviewed and stable  Post vital signs: Reviewed and stable  Complications: No apparent anesthesia complications

## 2011-06-22 NOTE — Interval H&P Note (Signed)
History and Physical Interval Note:  06/22/2011 7:31 AM  [name]  has presented today for surgery, with the diagnosis of loosening, failed total knee replacement  The various methods of treatment have been discussed with the patient and family. After consideration of risks, benefits and other options for treatment, the patient has consented to  Procedure(s) (LRB): TOTAL KNEE REVISION (Left) as a surgical intervention .  The patients' history has been reviewed, patient examined, no change in status, stable for surgery.  I have reviewed the patients' chart and labs.  Questions were answered to the patient's satisfaction.     [name]

## 2011-06-22 NOTE — Anesthesia Postprocedure Evaluation (Signed)
  Anesthesia Post-op Note  Patient: [name]  Procedure(s) Performed: Procedure(s) (LRB): EXCISIONAL TOTAL KNEE ARTHROPLASTY WITH ANTIBIOTIC SPACERS (Left)  Patient Location: PACU  Anesthesia Type: GA combined with regional for post-op pain  Level of Consciousness: awake, alert , oriented, sedated and patient cooperative  Airway and Oxygen Therapy: Patient Spontanous Breathing and Patient connected to nasal cannula oxygen  Post-op Pain: moderate  Post-op Assessment: Post-op Vital signs reviewed, Patient's Cardiovascular Status Stable, Respiratory Function Stable, Patent Airway, No signs of Nausea or vomiting and Pain level controlled  Post-op Vital Signs: stable  Complications: No apparent anesthesia complications

## 2011-06-22 NOTE — Progress Notes (Signed)
Orthopedic Tech Progress Note Patient Details:  [name] 1945-12-26 [id number]  Patient ID: [name], female   DOB: January 10, 1946, 66 y.o.   MRN: [id number]   [name] 06/22/2011, 12:42 PM Trapeze bar

## 2011-06-22 NOTE — Interval H&P Note (Signed)
History and Physical Interval Note:  06/22/2011 7:30 AM  [name]  has presented today for surgery, with the diagnosis of loosening, failed total knee replacement  The various methods of treatment have been discussed with the patient and family. After consideration of risks, benefits and other options for treatment, the patient has consented to  Procedure(s) (LRB): TOTAL KNEE REVISION (Left) as a surgical intervention .  The patients' history has been reviewed, patient examined, no change in status, stable for surgery.  I have reviewed the patients' chart and labs.  Questions were answered to the patient's satisfaction.     [name]

## 2011-06-22 NOTE — Brief Op Note (Signed)
06/22/2011  10:41 AM  PATIENT:  [name]  66 y.o. 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 PROCEDURE:  Procedure(s) (LRB): EXCISIONAL TOTAL KNEE ARTHROPLASTY WITH ANTIBIOTIC SPACERS (Left)  SURGEON:  Surgeon(s) and Role:    * Valeria Batman, MD - Primary  PHYSICIAN ASSISTANT: Silvestre Moment  ASSISTANTS: none   ANESTHESIA:   regional and general  EBL:  Total I/O In: 1000 [I.V.:1000] Out: 625 [Urine:625]  BLOOD ADMINISTERED:none  DRAINS: Penrose drain in the left knee   LOCAL MEDICATIONS USED:  MARCAINE     SPECIMEN:  Biopsy / Limited Resection  DISPOSITION OF SPECIMEN:  PATHOLOGY  COUNTS:  YES  TOURNIQUET:   Total Tourniquet Time Documented: Thigh (Left) - 125 minutes  DICTATION: .Other Dictation: Dictation Number [id number]  PLAN OF CARE: Admit to inpatient   PATIENT DISPOSITION:  PACU - hemodynamically stable.   Delay start of Pharmacological VTE agent (>24hrs) due to surgical blood loss or risk of bleeding: no PATIENT ID:      [name]  MRN:     [id number] DOB/AGE:    1946/02/12 / 66 y.o.       OPERATIVE REPORT    DATE OF PROCEDURE:  06/22/2011       PREOPERATIVE DIAGNOSIS:   loosening, failed total knee replacement                                                       Estimated Body mass index is 31.64 kg/(m^2) as calculated from the following:   Height as of this encounter: 5\' 6" (1.676 m).   Weight as of this encounter: 196 lb(88.905 kg).     POSTOPERATIVE DIAGNOSIS:   loosening, failed total knee replacement                                                                     Estimated Body mass index is 31.64 kg/(m^2) as calculated from the following:   Height as of this encounter: 5\' 6" (1.676 m).   Weight as of this encounter: 196 lb(88.905 kg).     PROCEDURE:  Procedure(s): EXCISIONAL TOTAL KNEE  ARTHROPLASTY WITH ANTIBIOTIC SPACERS     SURGEON: Alieyah Spader Conway    ASSISTANT:   Crystal Code, PA-C   (Present and scrubbed throughout the case, critical for assistance with exposure, retraction, instrumentation, and closure.)          ANESTHESIA: General      DRAINS: Penrose drain in the left knee :      TOURNIQUET TIME:  Total Tourniquet Time Documented: Thigh (Left) - 125 minutes    COMPLICATIONS:  None   CONDITION:  stable  PROCEDURE IN DETAIL: [id number]   [name] 06/22/2011, 10:42 AM

## 2011-06-22 NOTE — Interval H&P Note (Signed)
History and Physical Interval Note:  06/22/2011 7:31 AM  [name]  has presented today for surgery, with the diagnosis of loosening, failed total knee replacement  The various methods of treatment have been discussed with the patient and family. After consideration of risks, benefits and other options for treatment, the patient has consented to  Procedure(s) (LRB): TOTAL KNEE REVISION (Left) as a surgical intervention .  The patients' history has been reviewed, patient examined, no change in status, stable for surgery.  I have reviewed the patients' chart and labs.  Questions were answered to the patient's satisfaction.     [name] W   

## 2011-06-23 DIAGNOSIS — I10 Essential (primary) hypertension: Secondary | ICD-10-CM | POA: Diagnosis present

## 2011-06-23 DIAGNOSIS — D649 Anemia, unspecified: Secondary | ICD-10-CM | POA: Diagnosis present

## 2011-06-23 DIAGNOSIS — D62 Acute posthemorrhagic anemia: Secondary | ICD-10-CM | POA: Diagnosis not present

## 2011-06-23 DIAGNOSIS — T8484XA Pain due to internal orthopedic prosthetic devices, implants and grafts, initial encounter: Secondary | ICD-10-CM

## 2011-06-23 DIAGNOSIS — T8450XA Infection and inflammatory reaction due to unspecified internal joint prosthesis, initial encounter: Secondary | ICD-10-CM

## 2011-06-23 DIAGNOSIS — Y849 Medical procedure, unspecified as the cause of abnormal reaction of the patient, or of later complication, without mention of misadventure at the time of the procedure: Secondary | ICD-10-CM

## 2011-06-23 DIAGNOSIS — E669 Obesity, unspecified: Secondary | ICD-10-CM | POA: Diagnosis present

## 2011-06-23 DIAGNOSIS — E785 Hyperlipidemia, unspecified: Secondary | ICD-10-CM | POA: Diagnosis present

## 2011-06-23 LAB — CBC
HCT: 24.6 % — ABNORMAL LOW (ref 36.0–46.0)
MCH: 31.7 pg (ref 26.0–34.0)
MCHC: 33.7 g/dL (ref 30.0–36.0)
MCV: 96.1 fL (ref 78.0–100.0)
Platelets: 206 10*3/uL (ref 150–400)
Platelets: 162 10*3/uL (ref 150–400)
RBC: 3.19 MIL/uL — ABNORMAL LOW (ref 3.87–5.11)
RDW: 14.2 % (ref 11.5–15.5)

## 2011-06-23 LAB — BASIC METABOLIC PANEL
BUN: 8 mg/dL (ref 6–23)
Calcium: 8.7 mg/dL (ref 8.4–10.5)
Creatinine, Ser: 0.83 mg/dL (ref 0.50–1.10)
GFR calc Af Amer: 83 mL/min — ABNORMAL LOW (ref >90)
GFR calc non Af Amer: 72 mL/min — ABNORMAL LOW (ref >90)

## 2011-06-23 MED ORDER — ACETAMINOPHEN 10 MG/ML IV SOLN
1000.0000 mg | Freq: Four times a day (QID) | INTRAVENOUS | Status: AC
  Administered 2011-06-23 – 2011-06-24 (×3): 1000 mg via INTRAVENOUS
  Filled 2011-06-23 (×4): qty 100

## 2011-06-23 MED ORDER — VANCOMYCIN HCL IN DEXTROSE 1-5 GM/200ML-% IV SOLN
1000.0000 mg | Freq: Two times a day (BID) | INTRAVENOUS | Status: DC
  Administered 2011-06-23 – 2011-06-24 (×4): 1000 mg via INTRAVENOUS
  Filled 2011-06-23 (×6): qty 200

## 2011-06-23 MED ORDER — CIPROFLOXACIN HCL 750 MG PO TABS
750.0000 mg | ORAL_TABLET | Freq: Two times a day (BID) | ORAL | Status: DC
  Administered 2011-06-23 – 2011-06-24 (×4): 750 mg via ORAL
  Filled 2011-06-23 (×7): qty 1

## 2011-06-23 NOTE — Progress Notes (Signed)
Patient ID: [name], female   DOB: Apr 15, 1946, 66 y.o.   MRN: [id number] PATIENT ID: [name]        MRN:  [id number]          DOB/AGE: April 13, 1946 / 66 y.o.  [name], MD   Jacqualine Code, PA-C [address] [place], [place], [place]  [address]                             [phone]   PROGRESS NOTE  Subjective:  negative for Chest Pain  negative for Shortness of Breath  negative for Nausea/Vomiting   negative for Calf Pain  negative for Bowel Movement   Tolerating Diet: yes         Patient reports pain as mild.    Objective: Vital signs in last 24 hours:   Patient Vitals for the past 24 hrs:  BP Temp Temp src Pulse Resp SpO2 Height Weight  06/23/11 0438 - - - - 16  98 % - -  06/23/11 0203 111/64 mmHg 98.5 F (36.9 C) - 70  18  98 % - -  06/22/11 2357 - - - - 14  99 % - -  06/22/11 2107 110/65 mmHg 99.1 F (37.3 C) - 67  18  99 % - -  06/22/11 2000 - - - - 15  100 % - -  06/22/11 1813 103/62 mmHg 97.1 F (36.2 C) - 73  16  100 % - -  06/22/11 1552 - - - - 12  100 % - -  06/22/11 1433 126/80 mmHg 97.8 F (36.6 C) Oral 54  12  100 % 5\' 6"  (1.676 m) 88.905 kg (196 lb)  06/22/11 1332 - 97.1 F (36.2 C) - - - - - -  06/22/11 1331 110/55 mmHg - - 58  11  100 % - -  06/22/11 1330 - - - 63  13  100 % - -  06/22/11 1315 129/69 mmHg - - 56  14  100 % - -  06/22/11 1300 - - - 59  13  100 % - -  06/22/11 1245 122/75 mmHg - - 61  10  100 % - -  06/22/11 1230 107/64 mmHg - - 60  15  100 % - -  06/22/11 1215 106/60 mmHg - - 60  16  99 % - -  06/22/11 1200 113/56 mmHg - - 61  15  98 % - -  06/22/11 1145 106/62 mmHg 97.6 F (36.4 C) - 66  - - - -  06/22/11 1114 139/73 mmHg 98.3 F (36.8 C) - 62  - 100 % - -    @flow {1959:LAST@   Intake/Output from previous day:   03/05 0701 - 03/06 0700 In: 2895 [P.O.:120; I.V.:2775] Out: 3350 [Urine:3000; Drains:350]   Intake/Output this shift:       Intake/Output      03/05 0701 - 03/06 0700 03/06 0701 - 03/07 0700   P.O. 120     I.V. (mL/kg) 2775 (31.2)    Total Intake(mL/kg) 2895 (32.6)    Urine (mL/kg/hr) 3000 (1.4)    Drains 350    Total Output 3350    Net -455            LABORATORY DATA:  Basename 06/23/11 0530  WBC 7.5  HGB 8.3*  HCT 24.6*  PLT 206    Basename 06/23/11 0530  NA 140  K 4.0  CL 107  CO2 25  BUN 8  CREATININE 0.83  GLUCOSE 130*  CALCIUM 8.7   Lab Results  Component Value Date   INR 1.09 06/11/2011    Examination:  General appearance: alert, mild distress and mildly obese  Wound Exam: clean, dry, intact   Drainage:  None: wound tissue dry  Motor Exam: EHL, FHL, Anterior Tibial and Posterior Tibial Intact  Sensory Exam: Superficial Peroneal, Deep Peroneal and Tibial normal  Vascular Exam:pulses intact  Assessment:    1 Day Post-Op  Procedure(s) (LRB): EXCISIONAL TOTAL KNEE ARTHROPLASTY WITH ANTIBIOTIC SPACERS (Left)  ADDITIONAL DIAGNOSIS:  Principal Problem:  *Painful total knee replacement Active Problems:  Hypertension  Hyperlipidemia  Obesity (BMI 30-39.9)  Acute Blood Loss Anemia   Plan: Physical Therapy as ordered Partial Weight Bearing @ 50% (PWB)  DVT Prophylaxis:  Xarelto, Foot Pumps and TED hose  DISCHARGE PLAN: Home  DISCHARGE NEEDS: HHPT, CPM and 3-in-1 comode seat         Lynore Coscia W 06/23/2011, 8:01 AM

## 2011-06-23 NOTE — Consult Note (Signed)
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  Culture sent and I will start IVvancomycin for now and oral cipro while awaiting cultures.  I will have a PICC inserted.     HPI: [name] is a 66 y.o. female with history of TKA in 2005 and off and on pain since with exacerbation of pain beginning about January this year.  Pain worsened with activity.  Bone scan with concern for infection or loosening and aspiration with 660 white cells, no crystals.  No recent fever or chills.  No recent antibiotics orallly.    Past Medical History  Diagnosis Date  . PONV (postoperative nausea and vomiting)     scop patch before prior surgeries  . Hypertension   . Hyperlipemia     Allergies: No Known Allergies  Current antibiotics:   MEDICATIONS:    . acetaminophen  1,000 mg Intravenous Q6H  . acetaminophen  1,000 mg Intravenous Q6H  . docusate sodium  100 mg Oral BID  . folic acid  1 mg Oral Daily  . HYDROmorphone      . HYDROmorphone PCA 0.3 mg/mL      . lisinopril  10 mg Oral Daily  . methocarbamol(ROBAXIN) IV  500 mg Intravenous To PACU  . nebivolol  10 mg Oral Daily  . niacin  250 mg Oral Daily  . rivaroxaban  10 mg Oral Q24H  . vancomycin  1,000 mg Intravenous Q12H  . DISCONTD: acetaminophen  1,000 mg Intravenous Once  . DISCONTD: HYDROmorphone PCA 0.3 mg/mL   Intravenous Q4H  . DISCONTD: ketorolac  15 mg Intravenous Q6H  . DISCONTD: Niacin CR  250 mg Oral Daily  . DISCONTD: vancomycin  4,000 mg Other To OR    History  Substance Use Topics  . Smoking status: Never Smoker   . Smokeless tobacco: Not on file  . Alcohol Use: No    History reviewed. No pertinent family history.  Review of Systems - Negative except as per the HPI  OBJECTIVE: Temp:  [97.1 F (36.2 C)-99.1 F (37.3 C)] 98.3 F (36.8 C) (03/06  0930) Pulse Rate:  [54-75] 75  (03/06 0930) Resp:  [10-18] 18  (03/06 0930) BP: (103-142)/(55-81) 142/81 mmHg (03/06 0930) SpO2:  [98 %-100 %] 99 % (03/06 0847) Weight:  [196 lb (88.905 kg)] 196 lb (88.905 kg) (03/05 1433) General appearance: alert, cooperative and no distress Resp: clear to auscultation bilaterally Cardio: regular rate and rhythm, S1, S2 normal, no murmur, click, rub or gallop GI: soft, non-tender; bowel sounds normal; no masses,  no organomegaly Extremities: wrapped  LABS: Results for orders placed during the hospital encounter of 06/22/11 (from the past 48 hour(s))  WOUND CULTURE     Status: Normal (Preliminary result)   Collection Time   06/22/11  8:13 AM      Component Value Range Comment   Specimen Description WOUND KNEE LEFT      Special Requests LEFT TOTAL KNEE REVISION      Gram Stain        Value: NO WBC SEEN     NO ORGANISMS SEEN     CALLED TO Mendel Corning RN 06/22/11 0915 COSTELLO B Performed at [facility]   Culture NO GROWTH 1 DAY      Report Status PENDING     GRAM STAIN     Status: Normal   Collection  Time   06/22/11  8:13 AM      Component Value Range Comment   Specimen Description WOUND KNEE LEFT      Special Requests LEFT TOTAL KNEE REVISION      Gram Stain        Value: NO WBC SEEN     NO ORGANISMS SEEN     CALLED TO Mendel Corning RN 06/22/11 0915 COSTELLO B   Report Status 06/22/2011 FINAL     BODY FLUID CULTURE     Status: Normal (Preliminary result)   Collection Time   06/22/11  8:13 AM      Component Value Range Comment   Specimen Description SYNOVIAL FLUID KNEE LEFT      Special Requests LEFT TOTAL KNEE      Gram Stain        Value: CYTOSPIN WBC PRESENT, PREDOMINANTLY MONONUCLEAR     NO ORGANISMS SEEN     CALLED TO Mendel Corning RN 06/22/11 0915 COSTELLO B Performed at [facility]   Culture NO GROWTH 1 DAY      Report Status PENDING     ANAEROBIC CULTURE     Status: Normal (Preliminary result)   Collection Time   06/22/11  8:13 AM       Component Value Range Comment   Specimen Description SYNOVIAL FLUID KNEE LEFT      Special Requests LEFT TOTAL KNEE      Gram Stain        Value: CYTOSPIN WBC PRESENT, PREDOMINANTLY MONONUCLEAR     NO ORGANISMS SEEN   Culture PENDING      Report Status PENDING     GRAM STAIN     Status: Normal   Collection Time   06/22/11  8:13 AM      Component Value Range Comment   Specimen Description SYNOVIAL FLUID KNEE LEFT      Special Requests LEFT TOTAL KNEE REVISION      Gram Stain        Value: CYTOSPIN SLIDE     WBC PRESENT, PREDOMINANTLY MONONUCLEAR     NO ORGANISMS SEEN     CALLED TO Mendel Corning RN 06/22/11 0915 COSTELLO B   Report Status 06/22/2011 FINAL     CBC     Status: Abnormal   Collection Time   06/23/11  5:30 AM      Component Value Range Comment   WBC 7.5  4.0 - 10.5 (K/uL)    RBC 2.56 (*) 3.87 - 5.11 (MIL/uL)    Hemoglobin 8.3 (*) 12.0 - 15.0 (g/dL)    HCT 16.1 (*) 09.6 - 46.0 (%)    MCV 96.1  78.0 - 100.0 (fL)    MCH 32.4  26.0 - 34.0 (pg)    MCHC 33.7  30.0 - 36.0 (g/dL)    RDW 04.5  40.9 - 81.1 (%)    Platelets 206  150 - 400 (K/uL)   BASIC METABOLIC PANEL     Status: Abnormal   Collection Time   06/23/11  5:30 AM      Component Value Range Comment   Sodium 140  135 - 145 (mEq/L)    Potassium 4.0  3.5 - 5.1 (mEq/L)    Chloride 107  96 - 112 (mEq/L)    CO2 25  19 - 32 (mEq/L)    Glucose, Bld 130 (*) 70 - 99 (mg/dL)    BUN 8  6 - 23 (mg/dL)    Creatinine, Ser 9.14  0.50 -  1.10 (mg/dL)    Calcium 8.7  8.4 - 10.5 (mg/dL)    GFR calc non Af Amer 72 (*) >90 (mL/min)    GFR calc Af Amer 83 (*) >90 (mL/min)   PREPARE RBC (CROSSMATCH)     Status: Normal   Collection Time   06/23/11  7:47 AM      Component Value Range Comment   Order Confirmation ORDER PROCESSED BY BLOOD BANK       MICRO:  IMAGING: No results found.  HISTORICAL MICRO/IMAGING

## 2011-06-23 NOTE — Progress Notes (Signed)
Physical Therapy Evaluation Patient Details Name: [name] MRN: [id number] DOB: 1945/09/27 Today's Date: 06/23/2011  Problem List:  Patient Active Problem List  Diagnoses  . Painful total knee replacement  . Hypertension  . Hyperlipidemia  . Obesity (BMI 30-39.9)  . Acute blood loss anemia  . Mild chronic anemia    Past Medical History:  Past Medical History  Diagnosis Date  . PONV (postoperative nausea and vomiting)     scop patch before prior surgeries  . Hypertension   . Hyperlipemia    Past Surgical History:  Past Surgical History  Procedure Date  . Appendectomy 1974  . Abdominal hysterectomy 1995  . Cholecystectomy 1996  . Joint replacement     lft knee 2005, rt knee 2006  . Colonoscopy     PT Assessment/Plan/Recommendation PT Assessment Clinical Impression Statement: 66 yo female s/p TKA revision with antibiotic spacer placement presents withdecreased functional mobility; will benefit form PT to maximize independence and safety with mobility, amb, steps, to enable safe dc home PT Recommendation/Assessment: Patient will need skilled PT in the acute care venue PT Problem List: Decreased strength;Decreased range of motion;Decreased mobility;Decreased knowledge of use of DME;Decreased knowledge of precautions;Pain PT Therapy Diagnosis : Difficulty walking;Acute pain PT Plan PT Frequency: 7X/week PT Treatment/Interventions: DME instruction;Gait training;Stair training;Functional mobility training;Therapeutic activities;Therapeutic exercise;Patient/family education PT Recommendation Recommendations for Other Services: OT consult Follow Up Recommendations: Home health PT;Supervision/Assistance - 24 hour Equipment Recommended: Rolling walker with 5" wheels;3 in 1 bedside comode;Tub/shower bench PT Goals  Acute Rehab PT Goals PT Goal Formulation: With patient Time For Goal Achievement: 7 days Pt will go Supine/Side to Sit: with supervision PT Goal: Supine/Side to  Sit - Progress: Goal set today Pt will go Sit to Supine/Side: with supervision PT Goal: Sit to Supine/Side - Progress: Goal set today Pt will go Sit to Stand: with supervision PT Goal: Sit to Stand - Progress: Goal set today Pt will go Stand to Sit: with supervision PT Goal: Stand to Sit - Progress: Goal set today Pt will Ambulate: >150 feet;with supervision;with rolling walker PT Goal: Ambulate - Progress: Goal set today Pt will Perform Home Exercise Program: with supervision, verbal cues required/provided PT Goal: Perform Home Exercise Program - Progress: Goal set today  PT Evaluation Precautions/Restrictions  Precautions Precautions: Knee Restrictions Weight Bearing Restrictions: Yes LLE Weight Bearing: Partial weight bearing LLE Partial Weight Bearing Percentage or Pounds: 50% Other Position/Activity Restrictions: Will get clarification of any specific ROM restrictions from Dr. Cleophas Dunker Prior Functioning  Home Living Lives With: Spouse Receives Help From: Family Type of Home: House Home Layout: One level Home Access: Stairs to enter Entrance Stairs-Rails: None Entrance Stairs-Number of Steps: 1 Bathroom Shower/Tub: Engineer, manufacturing systems: Standard Bathroom Accessibility: Yes How Accessible: Accessible via walker Home Adaptive Equipment:  (pt loaned most of her equipment out) Additional Comments: Pt and her sister very concerned about insurance being able to pay for her equipment; They state she obtained her RW and BSC in 2007; they would like a tub transfer bench, which could certainly be indicated -- I tried to explain that that piece of equipment would likely need to be out-of-pocket pay (I'm not sure how much they understood this); Will notify Case manager Prior Function Level of Independence: Independent with homemaking with ambulation Able to Take Stairs?: Yes Driving: Yes Cognition Cognition Arousal/Alertness: Awake/alert Overall Cognitive Status:  Appears within functional limits for tasks assessed Orientation Level: Oriented X4 Sensation/Coordination Sensation Light Touch: Appears Intact Coordination Gross Motor Movements are  Fluid and Coordinated: Yes Fine Motor Movements are Fluid and Coordinated: Yes Extremity Assessment RUE Assessment RUE Assessment: Within Functional Limits LUE Assessment LUE Assessment: Within Functional Limits RLE Assessment RLE Assessment: Within Functional Limits LLE Assessment LLE Assessment: Exceptions to [facility] LLE Strength LLE Overall Strength Comments: Grossly decr AROM and strength, limited by pain postop Mobility (including Balance) Bed Mobility Bed Mobility: Yes Supine to Sit: 4: Min assist;HOB flat Supine to Sit Details (indicate cue type and reason): cues for technique and physical assist for LLE Transfers Transfers: Yes Sit to Stand: 4: Min assist;With upper extremity assist;From bed;From chair/3-in-1 (multiple reps) Sit to Stand Details (indicate cue type and reason): cues for PWB LLE, safety, hand positioning Stand to Sit: 4: Min assist;To chair/3-in-1;With upper extremity assist Stand to Sit Details: cues for safety, control and LLE postioning Ambulation/Gait Ambulation/Gait: Yes Ambulation/Gait Assistance: 4: Min assist Ambulation/Gait Assistance Details (indicate cue type and reason): cues for gait sequence, and PWB LLE; very good gait overall, occasional cues fo rstep length and safety Ambulation Distance (Feet): 25 Feet (including some backwards stepping) Assistive device: Rolling walker Gait Pattern: Step-to pattern    Exercise  Heel slides, AAROM 5 reps Quad sets, AROM 10 reps   End of Session PT - End of Session Equipment Utilized During Treatment: Gait belt Activity Tolerance: Patient tolerated treatment well Patient left: in chair;with call bell in reach;with family/visitor present Nurse Communication: Mobility status for transfers;Mobility status for  ambulation General Behavior During Session: [facility] for tasks performed Cognition: [facility] for tasks performed  Van Clines [facility] [place], [place] [phone]  06/23/2011, 12:23 PM

## 2011-06-23 NOTE — Progress Notes (Signed)
Physical Therapy Note   06/23/11 1647  PT Visit Information  Last PT Received On 06/23/11  Precautions  Precautions Knee  Restrictions  Weight Bearing Restrictions Yes  LLE Weight Bearing PWB  LLE Partial Weight Bearing Percentage or Pounds 50%  Other Position/Activity Restrictions Will get clarification of any specific ROM restrictions from Dr. Cleophas Dunker  Bed Mobility  Supine to Sit 4: Min assist (guard assist)  Supine to Sit Details (indicate cue type and reason) cue for technique, did not require physical assist for LLE  Transfers  Sit to Stand 4: Min assist;With upper extremity assist;From bed  Sit to Stand Details (indicate cue type and reason) cues for safety, precautions  Stand to Sit 4: Min assist;With upper extremity assist;To bed  Stand to Sit Details cues for safety, control, optimal positioning for back to bed  Ambulation/Gait  Ambulation/Gait Assistance 4: Min assist (guard assist with and without physical contact)  Ambulation/Gait Assistance Details (indicate cue type and reason) cues for PWB and gait sequence; good maintenance of PWB  Ambulation Distance (Feet) 80 Feet  Assistive device Rolling walker  Gait Pattern Step-to pattern  PT - End of Session  Equipment Utilized During Treatment Gait belt  Activity Tolerance Patient tolerated treatment well  Patient left in chair;with call bell in reach;with family/visitor present  Nurse Communication Mobility status for transfers;Mobility status for ambulation  General  Behavior During Session [facility] for tasks performed  Cognition [facility] for tasks performed  PT - Assessment/Plan  Comments on Treatment Session Excellent progress with bed mobility, amb  PT Plan Discharge plan remains appropriate  PT Frequency 7X/week  Follow Up Recommendations Home health PT;Supervision/Assistance - 24 hour  Equipment Recommended Rolling walker with 5" wheels;3 in 1 bedside comode;Tub/shower bench  Acute Rehab PT Goals  Time For Goal  Achievement 7 days  Pt will go Supine/Side to Sit with supervision  PT Goal: Supine/Side to Sit - Progress Progressing toward goal  Pt will go Sit to Supine/Side with supervision  PT Goal: Sit to Supine/Side - Progress Progressing toward goal  Pt will go Sit to Stand with supervision  PT Goal: Sit to Stand - Progress Progressing toward goal  Pt will go Stand to Sit with supervision  PT Goal: Stand to Sit - Progress Progressing toward goal  Pt will Ambulate >150 feet;with supervision;with rolling walker  PT Goal: Ambulate - Progress Progressing toward goal    Van Clines, [place] [phone]

## 2011-06-23 NOTE — Progress Notes (Signed)
UR COMPLETED  

## 2011-06-23 NOTE — Progress Notes (Signed)
ANTIBIOTIC CONSULT NOTE - INITIAL  Pharmacy Consult for vancomycin Indication: empiric s/p excisional TKA with abx spacers  No Known Allergies  Patient Measurements: Height: 5\' 6"  (167.6 cm) Weight: 196 lb (88.905 kg) IBW/kg (Calculated) : 59.3   Vital Signs: Temp: 98.3 F (36.8 C) (03/06 0930) BP: 142/81 mmHg (03/06 0930) Pulse Rate: 75  (03/06 0930) Intake/Output from previous day: 03/05 0701 - 03/06 0700 In: 2895 [P.O.:120; I.V.:2775] Out: 3350 [Urine:3000; Drains:350] Intake/Output from this shift:    Labs:  Basename 06/23/11 0530  WBC 7.5  HGB 8.3*  PLT 206  LABCREA --  CREATININE 0.83   Estimated Creatinine Clearance: 74.8 ml/min (by C-G formula based on Cr of 0.83). No results found for this basename: VANCOTROUGH:2,VANCOPEAK:2,VANCORANDOM:2,GENTTROUGH:2,GENTPEAK:2,GENTRANDOM:2,TOBRATROUGH:2,TOBRAPEAK:2,TOBRARND:2,AMIKACINPEAK:2,AMIKACINTROU:2,AMIKACIN:2, in the last 72 hours   Microbiology: Recent Results (from the past 720 hour(s))  SURGICAL PCR SCREEN     Status: Abnormal   Collection Time   06/11/11  1:47 PM      Component Value Range Status Comment   MRSA, PCR NEGATIVE  NEGATIVE  Final    Staphylococcus aureus POSITIVE (*) NEGATIVE  Final   URINE CULTURE     Status: Normal   Collection Time   06/11/11  1:48 PM      Component Value Range Status Comment   Specimen Description URINE, CLEAN CATCH   Final    Special Requests NONE   Final    Culture  Setup Time [id number]   Final    Colony Count NO GROWTH   Final    Culture NO GROWTH   Final    Report Status 06/12/2011 FINAL   Final   WOUND CULTURE     Status: Normal (Preliminary result)   Collection Time   06/22/11  8:13 AM      Component Value Range Status Comment   Specimen Description WOUND KNEE LEFT   Final    Special Requests LEFT TOTAL KNEE REVISION   Final    Gram Stain     Final    Value: NO WBC SEEN     NO ORGANISMS SEEN     CALLED TO Mendel Corning RN 06/22/11 0915 COSTELLO B Performed at [facility]   Culture NO GROWTH 1 DAY   Final    Report Status PENDING   Incomplete   GRAM STAIN     Status: Normal   Collection Time   06/22/11  8:13 AM      Component Value Range Status Comment   Specimen Description WOUND KNEE LEFT   Final    Special Requests LEFT TOTAL KNEE REVISION   Final    Gram Stain     Final    Value: NO WBC SEEN     NO ORGANISMS SEEN     CALLED TO Mendel Corning RN 06/22/11 0915 COSTELLO B   Report Status 06/22/2011 FINAL   Final   BODY FLUID CULTURE     Status: Normal (Preliminary result)   Collection Time   06/22/11  8:13 AM      Component Value Range Status Comment   Specimen Description SYNOVIAL FLUID KNEE LEFT   Final    Special Requests LEFT TOTAL KNEE   Final    Gram Stain     Final    Value: CYTOSPIN WBC PRESENT, PREDOMINANTLY MONONUCLEAR     NO ORGANISMS SEEN     CALLED TO Mendel Corning RN 06/22/11 0915 COSTELLO B Performed at [facility]   Culture NO GROWTH 1 DAY  Final    Report Status PENDING   Incomplete   ANAEROBIC CULTURE     Status: Normal (Preliminary result)   Collection Time   06/22/11  8:13 AM      Component Value Range Status Comment   Specimen Description SYNOVIAL FLUID KNEE LEFT   Final    Special Requests LEFT TOTAL KNEE   Final    Gram Stain     Final    Value: CYTOSPIN WBC PRESENT, PREDOMINANTLY MONONUCLEAR     NO ORGANISMS SEEN   Culture PENDING   Incomplete    Report Status PENDING   Incomplete   GRAM STAIN     Status: Normal   Collection Time   06/22/11  8:13 AM      Component Value Range Status Comment   Specimen Description SYNOVIAL FLUID KNEE LEFT   Final    Special Requests LEFT TOTAL KNEE REVISION   Final    Gram Stain     Final    Value: CYTOSPIN SLIDE     WBC PRESENT, PREDOMINANTLY MONONUCLEAR     NO ORGANISMS SEEN     CALLED TO Mendel Corning RN 06/22/11 0915 COSTELLO B   Report Status 06/22/2011 FINAL   Final     Medical History: Past Medical History  Diagnosis Date  . PONV (postoperative nausea and vomiting)      scop patch before prior surgeries  . Hypertension   . Hyperlipemia     Medications:  Scheduled:    . acetaminophen  1,000 mg Intravenous Q6H  . acetaminophen  1,000 mg Intravenous Q6H  . docusate sodium  100 mg Oral BID  . folic acid  1 mg Oral Daily  . HYDROmorphone      . HYDROmorphone PCA 0.3 mg/mL      . lisinopril  10 mg Oral Daily  . methocarbamol(ROBAXIN) IV  500 mg Intravenous To PACU  . nebivolol  10 mg Oral Daily  . niacin  250 mg Oral Daily  . rivaroxaban  10 mg Oral Q24H  . DISCONTD: acetaminophen  1,000 mg Intravenous Once  . DISCONTD: HYDROmorphone PCA 0.3 mg/mL   Intravenous Q4H  . DISCONTD: ketorolac  15 mg Intravenous Q6H  . DISCONTD: Niacin CR  250 mg Oral Daily  . DISCONTD: vancomycin  4,000 mg Other To OR   Assessment: 66 yo F for empiric vancomycin s/p excisional TKA with abx spacers s/p L TKA 2005 with increasing pain since Jan.   Goal of Therapy:  Vancomycin trough level 15-20 mcg/ml  Plan:  Vancomycin 1g iv q12h F/u cultures, renal function, clinical progress F/u vanc trough if tx continues >7days  Avir Deruiter L. Illene Bolus, PharmD, BCPS Clinical Pharmacist Pager: [phone] 06/23/2011 10:30 AM

## 2011-06-24 LAB — TYPE AND SCREEN
ABO/RH(D): O NEG
Antibody Screen: NEGATIVE
Unit division: 0

## 2011-06-24 LAB — CBC
MCH: 31 pg (ref 26.0–34.0)
MCHC: 33.9 g/dL (ref 30.0–36.0)
Platelets: 157 10*3/uL (ref 150–400)
RDW: 16.8 % — ABNORMAL HIGH (ref 11.5–15.5)

## 2011-06-24 LAB — WOUND CULTURE
Culture: NO GROWTH
Gram Stain: NONE SEEN

## 2011-06-24 LAB — BASIC METABOLIC PANEL
Calcium: 8.2 mg/dL — ABNORMAL LOW (ref 8.4–10.5)
GFR calc Af Amer: 81 mL/min — ABNORMAL LOW (ref >90)
GFR calc non Af Amer: 70 mL/min — ABNORMAL LOW (ref >90)
Glucose, Bld: 111 mg/dL — ABNORMAL HIGH (ref 70–99)
Sodium: 137 mEq/L (ref 135–145)

## 2011-06-24 MED ORDER — WHITE PETROLATUM GEL
Status: AC
  Administered 2011-06-24
  Filled 2011-06-24: qty 5

## 2011-06-24 MED ORDER — SODIUM CHLORIDE 0.9 % IJ SOLN
10.0000 mL | INTRAMUSCULAR | Status: DC | PRN
  Administered 2011-06-25: 10 mL

## 2011-06-24 MED ORDER — OXYCODONE HCL 20 MG PO TB12
20.0000 mg | ORAL_TABLET | Freq: Two times a day (BID) | ORAL | Status: DC
  Administered 2011-06-24 – 2011-06-25 (×3): 20 mg via ORAL
  Filled 2011-06-24 (×4): qty 1

## 2011-06-24 MED ORDER — HYDROMORPHONE BOLUS VIA INFUSION
0.2000 mg | INTRAVENOUS | Status: DC | PRN
  Filled 2011-06-24: qty 1

## 2011-06-24 NOTE — Progress Notes (Signed)
Orthopedic Tech Progress Note Patient Details:  Alora Gorey 1945/10/24 [id number]  Patient ID: [name], female   DOB: 1945-05-23, 66 y.o.   MRN: [id number]   [name] 06/24/2011, 4:45 PM Called in biotech brace order at 1645 on 06/24/11.

## 2011-06-24 NOTE — Progress Notes (Signed)
CSW advised by [facility] that pt disposition is to home. This CSW not signing on.  Baxter Flattery, MSW [phone] Coverage for Ortho

## 2011-06-24 NOTE — Progress Notes (Signed)
PT PROGRESS NOTE  Discussed pt. With Jacqualine Code, PA .  Informed him that pt. Had reported "hairline wrist fracture" which her doctor had identified several weeks back (per pt.).  Pt. Wearing wrist splint.  Dr. Cleophas Dunker has ordered use of PFRW and ROM is limited to 35 degrees flexion currently( Dr. Hollace Kinnier PT co-worker Morrie Sheldon that CPM had been adjusted to 35 degrees).Will proceed accordingly during PT session tomorrow.  Weldon Picking PT Acute Rehab Services [phone] [place] [phone]

## 2011-06-24 NOTE — Progress Notes (Signed)
OT Cancellation Note  Treatment cancelled today due to Pt. fatigued, and not feeling well.  Assisted pt. back to bed.  Family with a multitude of questions.  Attempted to answer those. Will reattempt tomorrow Boykin Reaper [phone] 06/24/2011, 1:42 PM

## 2011-06-24 NOTE — Progress Notes (Signed)
CARE MANAGEMENT NOTE 06/24/2011  Action/Plan:   Discharge planning. Spoke with patient and her sister. Preoperatively setup with [facility], no changes. DME to be delivered by [employer]. Pt to get PICC today for IV antibiotics at home.   Anticipated DC Date:  06/25/2011   Anticipated DC Plan:  HOME W HOME HEALTH SERVICES      DC Planning Services  CM consult      PAC Choice  DURABLE MEDICAL EQUIPMENT  HOME HEALTH   Choice offered to / List presented to:     DME arranged  3-N-1  WALKER - ROLLING  CPM      DME agency  [employer]     HH arranged  HH-1 RN  HH-2 PT      HH agency  [facility]   Status of service:  Completed, signed off   Discharge Disposition:  HOME W HOME HEALTH SERVICES

## 2011-06-24 NOTE — Progress Notes (Signed)
PT TREATMENT NOTE  06/24/11 0952  PT Visit Information  Last PT Received On 06/24/11  Precautions  Precautions Knee  Restrictions  Weight Bearing Restrictions Yes  LLE Weight Bearing PWB  LLE Partial Weight Bearing Percentage or Pounds 50% (50%)  Other Position/Activity Restrictions left sticky note for Dr. Cleophas Dunker re:ROM restrictions  Bed Mobility  Bed Mobility Yes  Supine to Sit 4: Min assist  Transfers  Transfers Yes  Sit to Stand Details (indicate cue type and reason) cues for safety, precaution  Stand to Sit 4: Min assist;With upper extremity assist;With armrests;To chair/3-in-1  Stand to Sit Details cues for safety, precaution  Ambulation/Gait  Ambulation/Gait Yes  Ambulation/Gait Assistance 4: Min assist (second person to manage equipment)  Ambulation/Gait Assistance Details (indicate cue type and reason) cues for PWB and sequence  Ambulation Distance (Feet) 90 Feet  Assistive device Rolling walker  Gait Pattern Step-to pattern  Exercises  Exercises (ankle pumps and quad sets 10 reps)  PT - End of Session  Equipment Utilized During Treatment Gait belt  Activity Tolerance Patient tolerated treatment well  Patient left in chair;with call bell in reach;with family/visitor present  Nurse Communication Mobility status for transfers;Mobility status for ambulation  General  Behavior During Session [facility] for tasks performed  Cognition [facility] for tasks performed  PT - Assessment/Plan  Comments on Treatment Session Good progress with mobility.  Need clarification of ROM restrictions from Dr. Cleophas Dunker.  PT Plan Discharge plan remains appropriate  PT Frequency 7X/week  Recommendations for Other Services OT consult  Follow Up Recommendations Home health PT;Supervision/Assistance - 24 hour  Equipment Recommended Rolling walker with 5" wheels;3 in 1 bedside comode;Tub/shower bench  Acute Rehab PT Goals  PT Goal: Supine/Side to Sit - Progress Progressing toward goal  PT Goal: Sit  to Stand - Progress Progressing toward goal  PT Goal: Stand to Sit - Progress Progressing toward goal  PT Goal: Ambulate - Progress Progressing toward goal  PT Goal: Perform Home Exercise Program - Progress Progressing toward goal   Weldon Picking PT Acute Rehab Services [phone] Beeper [phone]

## 2011-06-24 NOTE — Progress Notes (Signed)
Orthopedic Tech Progress Note Patient Details:  [name] July 25, 1945 [id number] comp Patient ID: [name], female   DOB: 11/29/1945, 66 y.o.   MRN: [id number] compeleted  [name] 06/24/2011, 5:40 PM

## 2011-06-24 NOTE — Progress Notes (Signed)
Patient ID: Lonnetta Kniskern, female   DOB: 10-14-1945, 66 y.o.   MRN: [id number] PATIENT ID: Pranathi Winfree        MRN:  [id number]          DOB/AGE: 12-21-1945 / 67 y.o.  [name], MD   Jacqualine Code, PA-C [address] [place], [place], [place]  [address]                             [phone]   PROGRESS NOTE  Subjective:  negative for Chest Pain  negative for Shortness of Breath  positive for Nausea/Vomiting   negative for Calf Pain  negative for Bowel Movement   Tolerating Diet: yes         Patient reports pain as moderate.    Objective: Vital signs in last 24 hours:   Patient Vitals for the past 24 hrs:  BP Temp Pulse Resp SpO2  06/24/11 0500 145/84 mmHg 98.7 F (37.1 C) 73  20  96 %  06/23/11 2308 145/84 mmHg 98.3 F (36.8 C) 69  20  96 %  06/23/11 1915 131/70 mmHg 97.6 F (36.4 C) 72  18  -  06/23/11 1800 116/65 mmHg 97.9 F (36.6 C) 78  20  -  06/23/11 1655 152/81 mmHg 97.5 F (36.4 C) 77  20  -  06/23/11 1620 112/59 mmHg 97.4 F (36.3 C) 72  18  -    @flow {1959:LAST@   Intake/Output from previous day:   03/06 0701 - 03/07 0700 In: 1345 [P.O.:720] Out: 100 [Drains:100]   Intake/Output this shift:       Intake/Output      03/06 0701 - 03/07 0700 03/07 0701 - 03/08 0700   P.O. 720    I.V. (mL/kg)     Blood 625    Total Intake(mL/kg) 1345 (15.1)    Urine (mL/kg/hr)     Drains 100    Total Output 100    Net +1245         Urine Occurrence 7 x       LABORATORY DATA:  Basename 06/24/11 0525 06/23/11 2130 06/23/11 0530  WBC 7.2 6.6 7.5  HGB 9.9* 10.1* 8.3*  HCT 29.2* 29.5* 24.6*  PLT 157 162 206    Basename 06/24/11 0525 06/23/11 0530  NA 137 140  K 3.6 4.0  CL 101 107  CO2 28 25  BUN 9 8  CREATININE 0.85 0.83  GLUCOSE 111* 130*  CALCIUM 8.2* 8.7   Lab Results  Component Value Date   INR 1.09 06/11/2011    Examination:  General appearance: alert, cooperative and moderate distress  Wound Exam: clean, dry, intact   Drainage:  None:  wound tissue dry  Motor Exam: EHL, FHL, Anterior Tibial and Posterior Tibial Intact  Sensory Exam: Superficial Peroneal, Deep Peroneal and Tibial normal  Vascular Exam: pulses intact   Assessment:    2 Days Post-Op  Procedure(s) (LRB): EXCISIONAL TOTAL KNEE ARTHROPLASTY WITH ANTIBIOTIC SPACERS (Left)  ADDITIONAL DIAGNOSIS:  Principal Problem:  *Painful total knee replacement Active Problems:  Acute blood loss anemia  Mild chronic anemia  Hypertension  Hyperlipidemia  Obesity (BMI 30-39.9)  Acute Blood Loss Anemia   Plan: Physical Therapy as ordered Partial Weight Bearing @ 50% (PWB)  DVT Prophylaxis:  Xarelto, Foot Pumps and TED hose  DISCHARGE PLAN: Home  DISCHARGE NEEDS: HHPT, CPM, Walker, 3-in-1 comode seat and IV Antibiotics  [name] 06/24/2011, 2:57 PM

## 2011-06-25 DIAGNOSIS — Y831 Surgical operation with implant of artificial internal device as the cause of abnormal reaction of the patient, or of later complication, without mention of misadventure at the time of the procedure: Secondary | ICD-10-CM

## 2011-06-25 DIAGNOSIS — Z9889 Other specified postprocedural states: Secondary | ICD-10-CM | POA: Diagnosis not present

## 2011-06-25 DIAGNOSIS — T8450XA Infection and inflammatory reaction due to unspecified internal joint prosthesis, initial encounter: Secondary | ICD-10-CM

## 2011-06-25 LAB — BODY FLUID CULTURE: Culture: NO GROWTH

## 2011-06-25 LAB — CBC
Platelets: 174 10*3/uL (ref 150–400)
RDW: 16.3 % — ABNORMAL HIGH (ref 11.5–15.5)
WBC: 7.1 10*3/uL (ref 4.0–10.5)

## 2011-06-25 LAB — BASIC METABOLIC PANEL
Chloride: 100 mEq/L (ref 96–112)
GFR calc Af Amer: >90 mL/min (ref >90)
Potassium: 3.9 mEq/L (ref 3.5–5.1)
Sodium: 135 mEq/L (ref 135–145)

## 2011-06-25 LAB — COMPREHENSIVE METABOLIC PANEL
ALT: 12 U/L (ref 0–35)
AST: 15 U/L (ref 0–37)
Albumin: 3 g/dL — ABNORMAL LOW (ref 3.5–5.2)
CO2: 29 mEq/L (ref 19–32)
Calcium: 9.3 mg/dL (ref 8.4–10.5)
Chloride: 101 mEq/L (ref 96–112)
GFR calc non Af Amer: 86 mL/min — ABNORMAL LOW (ref >90)
Sodium: 137 mEq/L (ref 135–145)
Total Bilirubin: 1.2 mg/dL (ref 0.3–1.2)

## 2011-06-25 MED ORDER — ONDANSETRON HCL 4 MG PO TABS: 4.0000 mg | ORAL_TABLET | Freq: Four times a day (QID) | ORAL | Status: AC | PRN

## 2011-06-25 MED ORDER — OXYCODONE HCL 20 MG PO TB12: 20.0000 mg | ORAL_TABLET | Freq: Two times a day (BID) | ORAL | Status: AC

## 2011-06-25 MED ORDER — VANCOMYCIN HCL IN DEXTROSE 1-5 GM/200ML-% IV SOLN: 1000.0000 mg | Freq: Two times a day (BID) | INTRAVENOUS | Status: DC

## 2011-06-25 MED ORDER — CIPROFLOXACIN HCL 750 MG PO TABS: 750.0000 mg | ORAL_TABLET | Freq: Two times a day (BID) | ORAL | Status: AC

## 2011-06-25 MED ORDER — VANCOMYCIN HCL 1000 MG IV SOLR
1250.0000 mg | Freq: Two times a day (BID) | INTRAVENOUS | Status: DC
  Administered 2011-06-25: 1250 mg via INTRAVENOUS
  Filled 2011-06-25 (×2): qty 1250

## 2011-06-25 MED ORDER — OXYCODONE HCL 5 MG PO TABS: 5.0000 mg | ORAL_TABLET | ORAL | Status: AC | PRN

## 2011-06-25 MED ORDER — VANCOMYCIN HCL 1000 MG IV SOLR: 1250.0000 mg | Freq: Two times a day (BID) | INTRAVENOUS | Status: DC

## 2011-06-25 MED ORDER — METHOCARBAMOL 500 MG PO TABS: 500.0000 mg | ORAL_TABLET | Freq: Four times a day (QID) | ORAL | Status: AC | PRN

## 2011-06-25 MED ORDER — RIVAROXABAN 10 MG PO TABS: 10.0000 mg | ORAL_TABLET | ORAL | Status: DC

## 2011-06-25 MED ORDER — HEPARIN SOD (PORK) LOCK FLUSH 100 UNIT/ML IV SOLN
250.0000 [IU] | INTRAVENOUS | Status: AC | PRN
  Administered 2011-06-25: 500 [IU]

## 2011-06-25 NOTE — Progress Notes (Signed)
Orthopedic Tech Progress Note Patient Details:  [name] 1945-08-07 [id number]  Patient ID: [name], female   DOB: 23-Jun-1945, 66 y.o.   MRN: [id number] Brace order completed by bio tech  [name], [name] 06/25/2011, 3:19 PM

## 2011-06-25 NOTE — Progress Notes (Signed)
Occupational Therapy Evaluation Patient Details Name: [name] MRN: [id number] DOB: 09/22/1945 Today's Date: 06/25/2011  Problem List:  Patient Active Problem List  Diagnoses  . Painful total knee replacement  . Hypertension  . Hyperlipidemia  . Obesity (BMI 30-39.9)  . Acute blood loss anemia  . Mild chronic anemia    Past Medical History:  Past Medical History  Diagnosis Date  . PONV (postoperative nausea and vomiting)     scop patch before prior surgeries  . Hypertension   . Hyperlipemia    Past Surgical History:  Past Surgical History  Procedure Date  . Appendectomy 1974  . Abdominal hysterectomy 1995  . Cholecystectomy 1996  . Joint replacement     lft knee 2005, rt knee 2006  . 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 old knee removed and replaced with antiobiotic spacers. Pt will benfit from skille OT services to max indep with ADL and functional mobility for ADL to facilitate D/C home with [facility] services. Pt has 3 in 1. Requested interest in tub bench. Will need HHOT after D/C. OT Recommendation/Assessment: Patient will need skilled OT in the acute care venue OT Problem List: Decreased strength;Decreased range of motion;Decreased activity tolerance;Impaired balance (sitting and/or standing);Decreased knowledge of use of DME or AE;Decreased knowledge of precautions;Pain Barriers to Discharge: None OT Therapy Diagnosis : Generalized weakness;Acute pain OT Plan OT Frequency: Min 2X/week OT Treatment/Interventions: Self-care/ADL training;Therapeutic exercise;Energy conservation;DME and/or AE instruction;Therapeutic activities;Patient/family education;Balance training OT Recommendation Follow Up Recommendations: Home health OT Equipment Recommended: Rolling walker with 5" wheels;3 in 1 bedside comode;Tub/shower bench (may need platform attachment depending on if she has wrist f) Individuals  Consulted Consulted and Agree with Results and Recommendations: Patient OT Goals Acute Rehab OT Goals OT Goal Formulation: With patient Time For Goal Achievement: 7 days ADL Goals Pt Will Perform Lower Body Bathing: with supervision;with adaptive equipment;Unsupported;with cueing (comment type and amount) ADL Goal: Lower Body Bathing - Progress: Goal set today Pt Will Perform Lower Body Dressing: with supervision;Sit to stand from chair;with cueing (comment type and amount);with adaptive equipment ADL Goal: Lower Body Dressing - Progress: Goal set today Pt Will Transfer to Toilet: with supervision;with DME;3-in-1;Maintaining weight bearing status ADL Goal: Toilet Transfer - Progress: Goal set today Pt Will Perform Toileting - Clothing Manipulation: with supervision;Standing Pt Will Perform Toileting - Hygiene: Independently;Standing at 3-in-1/toilet;Sitting on 3-in-1 or toilet Pt Will Perform Tub/Shower Transfer: with min assist;Ambulation;with DME;Transfer tub bench;Grab bars;Maintaining weight bearing status;with cueing (comment type and amount) ADL Goal: Tub/Shower Transfer - Progress: Goal set today Additional ADL Goal #1: Pt will be indep with HEP for BUE strengthening to increase indep with ADL.  OT Evaluation Precautions/Restrictions  Precautions Precautions: Knee Restrictions Weight Bearing Restrictions: Yes LLE Weight Bearing: Partial weight bearing LLE Partial Weight Bearing Percentage or Pounds: 50% Other Position/Activity Restrictions: Bledsoe brace 0-35 Prior Functioning Home Living Lives With: Spouse Receives Help From: Family Type of Home: House Home Layout: One level Home Access: Stairs to enter Entrance Stairs-Rails: None Secretary/administrator of Steps: 1 Bathroom Shower/Tub: Engineer, manufacturing systems: Standard Bathroom Accessibility: Yes How Accessible: Accessible via walker Home Adaptive Equipment:  (pt loaned most of her equipment out) Additional  Comments: Pt and her sister very concerned about insurance being able to pay for her equipment; They state she obtained her RW and BSC in 2007; they would like a tub transfer bench, which could certainly be indicated -- I tried to  explain that that piece of equipment would likely need to be out-of-pocket pay (I'm not sure how much they understood this); Will notify Case manager Prior Function Level of Independence: Independent with homemaking with ambulation Able to Take Stairs?: Yes Driving: Yes ADL ADL Eating/Feeding: Simulated;Independent (c/o n/v) Where Assessed - Eating/Feeding: Chair Grooming: Simulated;Set up Where Assessed - Grooming: Sitting, chair Upper Body Bathing: Simulated;Set up Where Assessed - Upper Body Bathing: Sitting, chair Lower Body Bathing: Simulated;Maximal assistance Where Assessed - Lower Body Bathing: Sit to stand from chair Upper Body Dressing: Simulated;Set up Where Assessed - Upper Body Dressing: Sitting, chair Lower Body Dressing: Performed;Maximal assistance Where Assessed - Lower Body Dressing: Sit to stand from chair Toilet Transfer: Simulated;Minimal assistance;Other (comment) (steady) Toilet Transfer Method: Proofreader: Bedside commode Toileting - Clothing Manipulation: Performed;Supervision/safety Where Assessed - Toileting Clothing Manipulation: Standing;Sit to stand from 3-in-1 or toilet Toileting - Hygiene: Simulated;Independent Where Assessed - Toileting Hygiene: Sit on 3-in-1 or toilet Tub/Shower Transfer: Other (comment) (demonstrated to husband how to complete tub bench transfer) Equipment Used: Long-handled shoe horn;Long-handled sponge;Reacher;Rolling walker;Sock aid Ambulation Related to ADLs: steady A ADL Comments: A for LB ADL only. Demonstrated use of AE. Pt interested in using AE. With AE, pt could be mod I. Pt somewhat limited by c/o n/v. Vision/Perception  Vision - History Baseline Vision: No visual  deficits Cognition Cognition Arousal/Alertness: Awake/alert Overall Cognitive Status: Appears within functional limits for tasks assessed Orientation Level: Oriented X4 Sensation/Coordination Sensation Light Touch: Appears Intact Coordination Gross Motor Movements are Fluid and Coordinated: Yes Fine Motor Movements are Fluid and Coordinated: Yes Extremity Assessment RUE Assessment RUE Assessment: Within Functional Limits LUE Assessment LUE Assessment: Within Functional Limits Mobility  Bed Mobility Bed Mobility: No Transfers Transfers: Yes Sit to Stand: 4: Min assist;With upper extremity assist;From bed;From chair/3-in-1 (multiple reps) Stand to Sit: 4: Min assist;To chair/3-in-1;With upper extremity assist Stand to Sit Details: vc for safety Exercises   End of Session General Behavior During Session: [facility] for tasks performed Cognition: [facility] for tasks performed   [facility] 06/25/2011, 12:54 PM  [facility], OTR/L  [phone] 06/25/2011

## 2011-06-25 NOTE — Progress Notes (Signed)
INFECTIOUS DISEASE PROGRESS NOTE  ID: [name] is a 66 y.o. female with history of TKA in 2005 and off and on pain since with exacerbation of pain beginning about January this year. Pain worsened with activity. Bone scan with concern for infection or loosening and aspiration with 660 white cells, no crystals. No recent fever or chills. No recent antibiotics orallly.    Subjective: n/v  Abtx:  Anti-infectives     Start     Dose/Rate Route Frequency Ordered Stop   06/25/11 1430   vancomycin (VANCOCIN) 1,250 mg in sodium chloride 0.9 % 250 mL IVPB        1,250 mg 166.7 mL/hr over 90 Minutes Intravenous Every 12 hours 06/25/11 1350     06/25/11 0000   ciprofloxacin (CIPRO) 750 MG tablet        750 mg Oral 2 times daily 06/25/11 0806 07/05/11 2359   06/25/11 0000   vancomycin (VANCOCIN) 1 GM/200ML SOLN  Status:  Discontinued        1,000 mg 200 mL/hr over 60 Minutes Intravenous Every 12 hours 06/25/11 0806 06/25/11    06/25/11 0000   sodium chloride 0.9 % SOLN 250 mL with vancomycin 1000 MG SOLR 1,250 mg        1,250 mg 166.7 mL/hr over 90 Minutes Intravenous Every 12 hours 06/25/11 1538     06/23/11 1200   ciprofloxacin (CIPRO) tablet 750 mg        750 mg Oral 2 times daily 06/23/11 1058     06/23/11 1100   vancomycin (VANCOCIN) IVPB 1000 mg/200 mL premix  Status:  Discontinued        1,000 mg 200 mL/hr over 60 Minutes Intravenous Every 12 hours 06/23/11 1031 06/25/11 1350   06/22/11 0941   tobramycin (NEBCIN) powder  Status:  Discontinued          As needed 06/22/11 0942 06/22/11 1109   06/22/11 0939   vancomycin (VANCOCIN) powder  Status:  Discontinued          As needed 06/22/11 0940 06/22/11 1109   06/22/11 0900   vancomycin (VANCOCIN) powder 4,000 mg  Status:  Discontinued        4,000 mg Other To Surgery 06/22/11 0858 06/22/11 1422          Medications: I have reviewed the patient's current medications.  Objective: Vital signs in last 24 hours: Temp:  [97.6 F  (36.4 C)-99 F (37.2 C)] 97.6 F (36.4 C) (03/08 1322) Pulse Rate:  [61-78] 78  (03/08 1322) Resp:  [18-20] 20  (03/08 1322) BP: (117-137)/(61-66) 117/66 mmHg (03/08 1322) SpO2:  [96 %-97 %] 97 % (03/08 1322)  Mild distress with n/v General appearance: alert, cooperative and mild distress Resp: clear to auscultation bilaterally Extremities: wrapped,  Lab Results  Basename 06/25/11 0916 06/25/11 0510 06/24/11 0525  WBC -- 7.1 7.2  HGB -- 9.9* 9.9*  HCT -- 29.5* 29.2*  NA 137 135 --  K 4.2 3.9 --  CL 101 100 --  CO2 29 28 --  BUN 10 9 --  CREATININE 0.77 0.76 --  GLU -- -- --   Liver Panel  Basename 06/25/11 0916  PROT 6.4  ALBUMIN 3.0*  AST 15  ALT 12  ALKPHOS 111  BILITOT 1.2  BILIDIR --  IBILI --   Sedimentation Rate No results found for this basename: ESRSEDRATE in the last 72 hours C-Reactive Protein No results found for this basename: CRP:2 in the last 72 hours  Microbiology: Recent Results (from the past 240 hour(s))  ANAEROBIC CULTURE     Status: Normal (Preliminary result)   Collection Time   06/22/11  8:13 AM      Component Value Range Status Comment   Specimen Description WOUND KNEE LEFT   Final    Special Requests LEFT TOTAL KNEE REVISION   Final    Gram Stain PENDING   Incomplete    Culture     Final    Value: NO ANAEROBES ISOLATED; CULTURE IN PROGRESS FOR 5 DAYS   Report Status PENDING   Incomplete   WOUND CULTURE     Status: Normal   Collection Time   06/22/11  8:13 AM      Component Value Range Status Comment   Specimen Description WOUND KNEE LEFT   Final    Special Requests LEFT TOTAL KNEE REVISION   Final    Gram Stain     Final    Value: NO WBC SEEN     NO ORGANISMS SEEN     CALLED TO Mendel Corning RN 06/22/11 0915 COSTELLO B Performed at [facility]   Culture NO GROWTH 2 DAYS   Final    Report Status 06/24/2011 FINAL   Final   GRAM STAIN     Status: Normal   Collection Time   06/22/11  8:13 AM      Component Value Range Status  Comment   Specimen Description WOUND KNEE LEFT   Final    Special Requests LEFT TOTAL KNEE REVISION   Final    Gram Stain     Final    Value: NO WBC SEEN     NO ORGANISMS SEEN     CALLED TO Mendel Corning RN 06/22/11 0915 COSTELLO B   Report Status 06/22/2011 FINAL   Final   BODY FLUID CULTURE     Status: Normal   Collection Time   06/22/11  8:13 AM      Component Value Range Status Comment   Specimen Description SYNOVIAL FLUID KNEE LEFT   Final    Special Requests LEFT TOTAL KNEE   Final    Gram Stain     Final    Value: CYTOSPIN WBC PRESENT, PREDOMINANTLY MONONUCLEAR     NO ORGANISMS SEEN     CALLED TO Mendel Corning RN 06/22/11 0915 COSTELLO B Performed at [facility]   Culture NO GROWTH 3 DAYS   Final    Report Status 06/25/2011 FINAL   Final   ANAEROBIC CULTURE     Status: Normal (Preliminary result)   Collection Time   06/22/11  8:13 AM      Component Value Range Status Comment   Specimen Description SYNOVIAL FLUID KNEE LEFT   Final    Special Requests LEFT TOTAL KNEE   Final    Gram Stain     Final    Value: CYTOSPIN WBC PRESENT, PREDOMINANTLY MONONUCLEAR     NO ORGANISMS SEEN   Culture     Final    Value: NO ANAEROBES ISOLATED; CULTURE IN PROGRESS FOR 5 DAYS   Report Status PENDING   Incomplete   GRAM STAIN     Status: Normal   Collection Time   06/22/11  8:13 AM      Component Value Range Status Comment   Specimen Description SYNOVIAL FLUID KNEE LEFT   Final    Special Requests LEFT TOTAL KNEE REVISION   Final    Gram Stain  Final    Value: CYTOSPIN SLIDE     WBC PRESENT, PREDOMINANTLY MONONUCLEAR     NO ORGANISMS SEEN     CALLED TO Mendel Corning RN 06/22/11 0915 COSTELLO B   Report Status 06/22/2011 FINAL   Final     Studies/Results: No results found.   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  Weekly CBC, CMP, ESR, CRP, vanco trough.  To follow up with ID in 6 weeks.    Octavion Mollenkopf Infectious  Diseases 06/25/2011, 4:04 PM

## 2011-06-25 NOTE — Discharge Summary (Signed)
PATIENT ID:      [name]  MRN:     [id number] DOB/AGE:    67-10-1945 / 67 y.o.     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 PONV (postoperative nausea and vomiting)     scop patch before prior surgeries  . Hypertension   . Hyperlipemia     PROCEDURE: Procedure(s): EXCISIONAL TOTAL KNEE ARTHROPLASTY WITH ANTIBIOTIC SPACERS LEFT KNEE on 06/22/2011  CONSULTS:   Infectious disease Service  HISTORY: [name] is a very pleasant 66 year old white female who is seen back today for follow up of her left knee pain. She had a left total knee arthroplasty performed by Dr. Priscille Kluver in 2005. She states she has had pain off and on in the left knee since her surgery, but this began to exacerbate in January of this year. She has gotten to the point now where her pain is so severe and sharp that she is having difficulties tolerating the pain. It was of insidious onset recently such that caused her even to leave work. She went home and rested the knee and finally had improvement initially. When she is up and active the pain worsens. When she is at rest the pain is tolerable. She denies any recent history of injury or trauma to the knee. She has been using Advil intermittently throughout the day. She denies any fevers, shakes, or chills. Rest has been helpful, but activity again worsens her symptoms. We have sent her off for a bone scan as well as laboratory studies and an aspiration was performed and revealed:  LABS:  1. Laboratory studies of the blood  reveals a white count of 7,400. She does have some anemia with hemoglobin of 11.2. Sedimentation rate was 30. CRP was .74.  2. Aspiration revealed yellow, cloudy fluid with 660 white cells, 38 neutrophils, 54 lymphocytes, 7 monocytes, 1 eosinophil. There were no crystals noted. Glucose was 82. Protein was 4.1.  3. Cultures revealed no growth x 3 days. BONE SCAN: Three phase bone scan reveals asymmetric increased uptake around the left knee prosthesis that could be due to infection or loosening. This was around all three components.  She has failed conservative treatment and because of worsening pain is indicated for Removal of prosthesis and revision vs antibiotic spacer implantation staging.      HOSPITAL COURSE:  [name] is a 66 y.o. admitted on 06/22/2011 and found to have a diagnosis of loosening, failed total knee replacement.  After appropriate laboratory studies were obtained  they were taken to the operating room on 06/22/2011 and underwent Procedure(s): EXCISIONAL  LEFT TOTAL KNEE ARTHROPLASTY WITH ANTIBIOTIC SPACERS.   They were given perioperative antibiotics:  Anti-infectives     Start     Dose/Rate Route Frequency Ordered Stop   06/25/11 1430   vancomycin (VANCOCIN) 1,250 mg in sodium chloride 0.9 % 250 mL IVPB        1,250 mg 166.7 mL/hr over 90 Minutes Intravenous Every 12 hours 06/25/11 1350     06/25/11 0000   ciprofloxacin (CIPRO) 750 MG  tablet        750 mg Oral 2 times daily 06/25/11 0806 07/05/11 2359   06/25/11 0000   vancomycin (VANCOCIN) 1 GM/200ML SOLN        1,000 mg 200 mL/hr over 60 Minutes Intravenous Every 12 hours 06/25/11 0806     06/23/11 1200   ciprofloxacin (CIPRO) tablet 750 mg        750 mg Oral 2 times daily 06/23/11 1058     06/23/11 1100   vancomycin (VANCOCIN) IVPB 1000 mg/200 mL premix  Status:  Discontinued        1,000 mg 200 mL/hr over 60 Minutes Intravenous Every 12 hours 06/23/11 1031 06/25/11 1350   06/22/11 0941   tobramycin (NEBCIN)  powder  Status:  Discontinued          As needed 06/22/11 0942 06/22/11 1109   06/22/11 0939   vancomycin (VANCOCIN) powder  Status:  Discontinued          As needed 06/22/11 0940 06/22/11 1109   06/22/11 0900   vancomycin (VANCOCIN) powder 4,000 mg  Status:  Discontinued        4,000 mg Other To Surgery 06/22/11 0858 06/22/11 1422        . Blood products given:TWO UNITS PRBC on 06/23/2011  Tolerated the procedure well. Foley placed intraoperatively. Given Ofirmev preoperatively as well as Antibiotics listed above after removal of prosthesis and cultures were obtained.  Placed on CPM 0-40 degrees in PACU.  Toradol given X 2 doses.  On POD#1, the foley was discontinued and the IV saline locked.  PCA was discontinued.  O2 was weaned off the patient.  PT was begun as per protocol.  On POD#2, the dressing was changed. Hemovac pulled.   PT was continued.   On POD #3, patient developed nausea and vomiting which resolved with one dose of Zofran.  Bowel sounds were normal.  She was doing well.  .   The remainder of the hospital course was dedicated to ambulation and strengthening.   The patient was discharged on 3 Days Post-Op in  Stable condition.   DIAGNOSTIC STUDIES: Recent vital signs: Patient Vitals for the past 24 hrs:  BP Temp Temp src Pulse Resp SpO2  06/25/11 1322 117/66 mmHg 97.6 F (36.4 C) Oral 78  20  97 %  06/25/11 0809 121/63 mmHg 98.1 F (36.7 C) - 77  18  97 %  07-12-11 2238 137/61 mmHg 99 F (37.2 C) Oral 61  18  96 %  07-12-2011 1521 160/84 mmHg 99 F (37.2 C) - 68  20  94 %       Recent laboratory studies:  Basename 06/25/11 0510 Jul 12, 2011 0525 06/23/11 2130 06/23/11 0530  WBC 7.1 7.2 6.6 7.5  HGB 9.9* 9.9* 10.1* 8.3*  HCT 29.5* 29.2* 29.5* 24.6*  PLT 174 157 162 206    Basename 06/25/11 0916 06/25/11 0510 12-Jul-2011 0525 06/23/11 0530  NA 137 135 137 140  K 4.2 3.9 3.6 4.0  CL 101 100 101 107  CO2 29 28 28 25   BUN 10 9 9 8   CREATININE 0.77 0.76 0.85 0.83    GLUCOSE 123* 114* 111* 130*  CALCIUM 9.3 8.7 8.2* 8.7   Lab Results  Component Value Date   INR 1.09 06/11/2011     Recent Radiographic Studies :   Chest 2 View  06/11/2011  *RADIOLOGY REPORT*  Clinical Data: Preoperative evaluation for left total knee arthroplasty.  No current chest complaints.  Nonsmoker.  History of hypertension and diabetes  CHEST - 2 VIEW  Comparison: 09/18/2003 at  Findings: Heart and mediastinal contours are stable and within normal limits.  The lung fields demonstrate several scattered calcified granulomata and these are stable.  No other focal infiltrates or signs of congestive failure are seen.  No pleural fluid or significant peribronchial cuffing is noted.  Bony structures appear intact.  Surgical clips are seen in the right upper quadrant.  IMPRESSION: Findings compatible with prior granulomatous disease.  No new focal worrisome or acute abnormalities seen  Original Report Authenticated By: Bertha Stakes, M.D.   Nm Bone Scan 3 Phase  06/01/2011  *RADIOLOGY REPORT*  Clinical Data: Left knee pain.  Bilateral knee prosthesis.  NUCLEAR MEDICINE THREE PHASE BONE SCAN  Technique:  Radionuclide angiographic images, immediate static blood pool images, and 3-hour delayed static images were obtained after intravenous injection of radiopharmaceutical.  Radiopharmaceutical: 26.5 mCi technetium MDP.  Comparison: None.  Findings: Initial flow images demonstrates slight asymmetric increased flow to the left lower extremity compared to the right. The immediate static and delayed images demonstrate increased uptake around the femoral, patellar and tibial components of the left knee prosthesis.  Findings could be due to infection or loosening.  Slight increased uptake around the right knee prosthesis is felt to be within normal limits.  IMPRESSION: Asymmetric increased uptake around the left knee prosthesis could be due to infection or loosening.  Original Report Authenticated By: P.  Loralie Champagne, M.D.    DISCHARGE INSTRUCTIONS: Discharge Orders    Future Orders Please Complete By Expires   Diet general      Constipation Prevention      Comments:   Drink plenty of fluids.  Prune juice may be helpful.  You may use a stool softener, such as Colace (over the counter) 100 mg twice a day.  Use MiraLax (over the counter) for constipation as needed.   Increase activity slowly as tolerated      Weight Bearing as taught in Physical Therapy      Comments:   Use a walker or crutches as instructed in Physical Therapy (PT) 50 % wei\ght bearing   Patient may shower      Comments:   You may shower without a dressing once there is no drainage.  Do not wash over the wound.  If drainage remains, cover wound with plastic wrap and then shower.   Driving restrictions      Comments:   No driving for 6 weeks   Lifting restrictions      Comments:   No lifting for 6 weeks   CPM      Comments:   Continuous passive motion machine (CPM):      Use the CPM from 0 to 35 for 6-8 hours per day.      You may increase by 5 per day to comfort.  You may break it up into 2 or 3 sessions per day.      Use CPM for 2-3 weeks or until you are told to stop.   TED hose      Comments:   Use stockings (TED hose) for 3 weeks on right leg(s).  You may remove them at night for sleeping.   Change dressing      Comments:   Change dressing on Sunday, then change the dressing daily with sterile 4 x 4 inch gauze dressing and apply TED hose.  You may clean the incision with alcohol prior to redressing.  DISCHARGE MEDICATIONS:   Medication List  As of 06/25/2011  3:20 PM   STOP taking these medications         aspirin 81 MG tablet      HYDROcodone-acetaminophen 5-325 MG per tablet      traMADol 50 MG tablet         TAKE these medications         ciprofloxacin 750 MG tablet   Commonly known as: CIPRO   Take 1 tablet (750 mg total) by mouth 2 (two) times daily.      folic acid 1 MG tablet    Commonly known as: FOLVITE   Take 1 mg by mouth daily.      lisinopril 10 MG tablet   Commonly known as: PRINIVIL,ZESTRIL   Take 10 mg by mouth daily.      LIVALO 4 MG Tabs   Generic drug: Pitavastatin Calcium   Take 1 tablet by mouth daily.      methocarbamol 500 MG tablet   Commonly known as: ROBAXIN   Take 1 tablet (500 mg total) by mouth every 6 (six) hours as needed.      nebivolol 10 MG tablet   Commonly known as: BYSTOLIC   Take 10 mg by mouth daily.      ondansetron 4 MG tablet   Commonly known as: ZOFRAN   Take 1 tablet (4 mg total) by mouth every 6 (six) hours as needed for nausea.      oxyCODONE 5 MG immediate release tablet   Commonly known as: Oxy IR/ROXICODONE   Take 1-2 tablets (5-10 mg total) by mouth every 4 (four) hours as needed.      oxyCODONE 20 MG 12 hr tablet   Commonly known as: OXYCONTIN   Take 1 tablet (20 mg total) by mouth every 12 (twelve) hours.      rivaroxaban 10 MG Tabs tablet   Commonly known as: XARELTO   Take 1 tablet (10 mg total) by mouth daily.      SLO-NIACIN 250 MG Tbcr   Generic drug: Niacin CR   Take 250 mg by mouth daily.      vancomycin 1 GM/200ML Soln   Commonly known as: VANCOCIN   Inject 200 mLs (1,000 mg total) into the vein every 12 (twelve) hours.            FOLLOW UP VISIT:   Follow-up Information    Follow up with [facility], Claude Manges, MD. Schedule an appointment as soon as possible for a visit on 07/05/2011.   Contact information:   [address] [place] [address] [phone]          DISPOSITION:  Home    CONDITION:  Stable   Dorrene Bently 06/25/2011, 3:20 PM

## 2011-06-25 NOTE — Progress Notes (Signed)
ANTIBIOTIC CONSULT NOTE - FOLLOW UP  Pharmacy Consult for Vancomycin Indication: Infected knee  No Known Allergies  Patient Measurements: Height: 5\' 6"  (167.6 cm) Weight: 196 lb (88.905 kg) IBW/kg (Calculated) : 59.3  Adjusted Body Weight:    Vital Signs: Temp: 97.6 F (36.4 C) (03/08 1322) Temp src: Oral (03/08 1322) BP: 117/66 mmHg (03/08 1322) Pulse Rate: 78  (03/08 1322) Intake/Output from previous day: 03/07 0701 - 03/08 0700 In: 800 [P.O.:600; IV Piggyback:200] Out: 580 [Emesis/NG output:580] Intake/Output from this shift: Total I/O In: 480 [P.O.:480] Out: -   Labs:  Basename 06/25/11 0916 06/25/11 0510 06/24/11 0525 06/23/11 2130  WBC -- 7.1 7.2 6.6  HGB -- 9.9* 9.9* 10.1*  PLT -- 174 157 162  LABCREA -- -- -- --  CREATININE 0.77 0.76 0.85 --   Estimated Creatinine Clearance: 77.6 ml/min (by C-G formula based on Cr of 0.77).  Basename 06/25/11 1245  VANCOTROUGH 11.5  VANCOPEAK --  VANCORANDOM --  GENTTROUGH --  GENTPEAK --  GENTRANDOM --  TOBRATROUGH --  TOBRAPEAK --  TOBRARND --  AMIKACINPEAK --  AMIKACINTROU --  AMIKACIN --     Microbiology: Recent Results (from the past 720 hour(s))  SURGICAL PCR SCREEN     Status: Abnormal   Collection Time   06/11/11  1:47 PM      Component Value Range Status Comment   MRSA, PCR NEGATIVE  NEGATIVE  Final    Staphylococcus aureus POSITIVE (*) NEGATIVE  Final   URINE CULTURE     Status: Normal   Collection Time   06/11/11  1:48 PM      Component Value Range Status Comment   Specimen Description URINE, CLEAN CATCH   Final    Special Requests NONE   Final    Culture  Setup Time [id number]   Final    Colony Count NO GROWTH   Final    Culture NO GROWTH   Final    Report Status 06/12/2011 FINAL   Final   ANAEROBIC CULTURE     Status: Normal (Preliminary result)   Collection Time   06/22/11  8:13 AM      Component Value Range Status Comment   Specimen Description WOUND KNEE LEFT   Final    Special  Requests LEFT TOTAL KNEE REVISION   Final    Gram Stain PENDING   Incomplete    Culture     Final    Value: NO ANAEROBES ISOLATED; CULTURE IN PROGRESS FOR 5 DAYS   Report Status PENDING   Incomplete   WOUND CULTURE     Status: Normal   Collection Time   06/22/11  8:13 AM      Component Value Range Status Comment   Specimen Description WOUND KNEE LEFT   Final    Special Requests LEFT TOTAL KNEE REVISION   Final    Gram Stain     Final    Value: NO WBC SEEN     NO ORGANISMS SEEN     CALLED TO Mendel Corning RN 06/22/11 0915 COSTELLO B Performed at [facility]   Culture NO GROWTH 2 DAYS   Final    Report Status 06/24/2011 FINAL   Final   GRAM STAIN     Status: Normal   Collection Time   06/22/11  8:13 AM      Component Value Range Status Comment   Specimen Description WOUND KNEE LEFT   Final    Special Requests LEFT TOTAL KNEE  REVISION   Final    Gram Stain     Final    Value: NO WBC SEEN     NO ORGANISMS SEEN     CALLED TO Mendel Corning RN 06/22/11 0915 COSTELLO B   Report Status 06/22/2011 FINAL   Final   BODY FLUID CULTURE     Status: Normal   Collection Time   06/22/11  8:13 AM      Component Value Range Status Comment   Specimen Description SYNOVIAL FLUID KNEE LEFT   Final    Special Requests LEFT TOTAL KNEE   Final    Gram Stain     Final    Value: CYTOSPIN WBC PRESENT, PREDOMINANTLY MONONUCLEAR     NO ORGANISMS SEEN     CALLED TO Mendel Corning RN 06/22/11 0915 COSTELLO B Performed at [facility]   Culture NO GROWTH 3 DAYS   Final    Report Status 06/25/2011 FINAL   Final   ANAEROBIC CULTURE     Status: Normal (Preliminary result)   Collection Time   06/22/11  8:13 AM      Component Value Range Status Comment   Specimen Description SYNOVIAL FLUID KNEE LEFT   Final    Special Requests LEFT TOTAL KNEE   Final    Gram Stain     Final    Value: CYTOSPIN WBC PRESENT, PREDOMINANTLY MONONUCLEAR     NO ORGANISMS SEEN   Culture     Final    Value: NO ANAEROBES ISOLATED; CULTURE IN  PROGRESS FOR 5 DAYS   Report Status PENDING   Incomplete   GRAM STAIN     Status: Normal   Collection Time   06/22/11  8:13 AM      Component Value Range Status Comment   Specimen Description SYNOVIAL FLUID KNEE LEFT   Final    Special Requests LEFT TOTAL KNEE REVISION   Final    Gram Stain     Final    Value: CYTOSPIN SLIDE     WBC PRESENT, PREDOMINANTLY MONONUCLEAR     NO ORGANISMS SEEN     CALLED TO Mendel Corning RN 06/22/11 0915 COSTELLO B   Report Status 06/22/2011 FINAL   Final     Anti-infectives     Start     Dose/Rate Route Frequency Ordered Stop   06/25/11 0000   ciprofloxacin (CIPRO) 750 MG tablet        750 mg Oral 2 times daily 06/25/11 0806 07/05/11 2359   06/25/11 0000   vancomycin (VANCOCIN) 1 GM/200ML SOLN        1,000 mg 200 mL/hr over 60 Minutes Intravenous Every 12 hours 06/25/11 0806     06/23/11 1200   ciprofloxacin (CIPRO) tablet 750 mg        750 mg Oral 2 times daily 06/23/11 1058     06/23/11 1100   vancomycin (VANCOCIN) IVPB 1000 mg/200 mL premix        1,000 mg 200 mL/hr over 60 Minutes Intravenous Every 12 hours 06/23/11 1031     06/22/11 0941   tobramycin (NEBCIN) powder  Status:  Discontinued          As needed 06/22/11 0942 06/22/11 1109   06/22/11 0939   vancomycin (VANCOCIN) powder  Status:  Discontinued          As needed 06/22/11 0940 06/22/11 1109   06/22/11 0900   vancomycin (VANCOCIN) powder 4,000 mg  Status:  Discontinued  4,000 mg Other To Surgery 06/22/11 1478 06/22/11 1422          Assessment: 66 yo F for empiric vancomycin s/p excisional TKA with abx spacers s/p L TKA 2005 with increasing pain since Jan. Tmax 99. WBC 7.1. Vanco trough result 11.5 (drawn 2.25 hr late) due to DBIV delay. Trough likely still lower than goal or close to low end of goal range if it had been drawn on time. Will increase dose slightly.  Plan: Cipro 750mg  po BID as recommended by ID. Increase Vancomycin to 1250mg  IV q12h.  Anticoag: Xarelto 10mg /d  per MD: L TKA with abx spacers.  Cards: HTN/HLD: lisinopril, nebivolol, Niacin CR Max BP 160/84 with HR 61-68  Anemia: Hgb down to 8.3.  GI/Nutrition: Obesity  Heme/Onc: Acute on chronic: FA  Goal of Therapy:  Vancomycin trough level 15-20 mcg/ml  Plan:  Vancomycin 1g iv q12h F/u cultures, renal function, clinical progress F/u vanc trough if tx continues >7days  PTA meds issues: asa, tramadol, livalo not resumed   Goal of Therapy:  Vancomycin trough level 15-20 mcg/ml  Plan:  Increase Vanco to 1250mg  IV q12h.  Merilynn Finland, [employer] 06/25/2011,1:44 PM

## 2011-06-25 NOTE — Progress Notes (Signed)
PT TREATMENT NOTE  06/25/11 1339  PT Visit Information  Last PT Received On 06/25/11  Precautions  Precautions Knee  Restrictions  Weight Bearing Restrictions Yes  LLE Weight Bearing PWB  LLE Partial Weight Bearing Percentage or Pounds 50%  Other Position/Activity Restrictions Bledsoe brace 0-35  Bed Mobility  Bed Mobility Yes  Supine to Sit 4: Min assist  Supine to Sit Details (indicate cue type and reason) assist and cues for left LE  Transfers  Transfers Yes  Sit to Stand 5: Supervision;With upper extremity assist;From bed  Sit to Stand Details (indicate cue type and reason) vc's for technique and hand placement  Stand to Sit 5: Supervision;With upper extremity assist;To bed  Stand to Sit Details vc's for hand placement  Ambulation/Gait  Ambulation/Gait Yes  Ambulation/Gait Assistance 4: Min assist;Other (comment) (min guard assist with no physical contact)  Ambulation/Gait Assistance Details (indicate cue type and reason) improved sequencing and step lenght  Ambulation Distance (Feet) 60 Feet  Assistive device Rolling walker;Other (Comment) (pt. declined use of PFRW; husband insisted no fx in wrist )  Gait Pattern Step-to pattern  Stairs Yes  Stairs Assistance 4: Min assist  Stair Management Technique Backwards;With walker  Number of Stairs 1   Height of Stairs 4   PT - End of Session  Equipment Utilized During Treatment Gait belt;Other (comment) (bledsoe brace)  Activity Tolerance Patient tolerated treatment well  Patient left in bed;in CPM;with call bell in reach;with family/visitor present  Nurse Communication Mobility status for transfers;Mobility status for ambulation  General  Behavior During Session [facility] for tasks performed  [facility] for tasks performed  PT - Assessment/Plan  Comments on Treatment Session Pt. reports feeling much better this pm.  N/V has subsided.  Pt's husband insists pt's wrist was not fx'ed and was only bruised.  Says slplint is for  protecting arm fronm getting bumped again.  Pt. and husband decline use of platform attachment.  Is at a min guard level level for ambulation and would be ready for DC home with family when MD ready to DC.  PT Frequency 7X/week  Recommendations for Other Services OT consult  Follow Up Recommendations Home health PT;Supervision/Assistance - 24 hour  Equipment Recommended Rolling walker with 5" wheels;3 in 1 bedside comode;Tub/shower bench  Acute Rehab PT Goals  PT Goal: Supine/Side to Sit - Progress Progressing toward goal  PT Goal: Sit to Supine/Side - Progress Progressing toward goal  PT Goal: Sit to Stand - Progress Met  PT Goal: Stand to Sit - Progress Met  PT Goal: Ambulate - Progress Progressing toward goal   Weldon Picking PT Acute Rehab Services [phone] Beeper [phone]

## 2011-06-25 NOTE — Progress Notes (Signed)
PT TREATMENT NOTE  06/25/11 0837  PT Visit Information  Last PT Received On 06/25/11  Precautions  Precautions Knee  Restrictions  Weight Bearing Restrictions Yes  LLE Weight Bearing PWB  LLE Partial Weight Bearing Percentage or Pounds 50%  Other Position/Activity Restrictions Bledsoe brace 0-35  Bed Mobility  Bed Mobility Yes  Supine to Sit 4: Min assist  Supine to Sit Details (indicate cue type and reason) min assist primarily for left LE  Transfers  Transfers Yes  Sit to Stand 4: Min assist;From elevated surface;From bed  Sit to Stand Details (indicate cue type and reason) vc's for safe technique  Stand to Sit 4: Min assist;With upper extremity assist;With armrests;To chair/3-in-1  Stand to Sit Details vc's for safe technique  Ambulation/Gait  Ambulation/Gait Yes  Ambulation/Gait Assistance 1: +2 Total assist;Patient percentage (comment) (pt. 60%; pt. used PFRW which increased need for support)  Ambulation/Gait Assistance Details (indicate cue type and reason) cues for PWB and balance  Ambulation Distance (Feet) 4 Feet  Assistive device Left platform walker  Gait Pattern Step-to pattern  Exercises  Exercises Total Joint  Total Joint Exercises  Ankle Circles/Pumps AROM;Both;10 reps  [employer] Left;AROM;10 reps;Seated  PT - End of Session  Equipment Utilized During Treatment Gait belt;Other (comment) (bledsoe brace left LE)  Activity Tolerance Treatment limited secondary to medical complications (Comment) (Pt. with N/V with positive emesis.  RN is aware and in room.)  Patient left in chair;with call bell in reach;with family/visitor present  Nurse Communication Mobility status for transfers;Mobility status for ambulation  General  Behavior During Session [facility] for tasks performed  [facility] for tasks performed  PT - Assessment/Plan  Comments on Treatment Session Pt. has had some bouts of N/V overnight and this am which is hindering her progress today.Pt. had indicated  yesterday that she had a hairline fx. of left wrist and platform attachment was subsequently ordered by MD.  This am, pt's husband states he is unaware of actual fracture but that pt. is to use wrist splint.  This needs to be clarified, as I would hate to have pt. go home with platform RW if it is not needed.Will leave additional sticky note. for MD to address further.  PT Plan Discharge plan remains appropriate  PT Frequency 7X/week  Recommendations for Other Services OT consult  Follow Up Recommendations Home health PT;Supervision/Assistance - 24 hour  Equipment Recommended Rolling walker with 5" wheels;3 in 1 bedside comode;Tub/shower bench (may need platform attachment depending on if she has wrist f)  Acute Rehab PT Goals  PT Goal: Supine/Side to Sit - Progress Progressing toward goal  PT Goal: Sit to Stand - Progress Progressing toward goal  PT Goal: Stand to Sit - Progress Progressing toward goal  PT Goal: Ambulate - Progress Not progressing (due to N/V)  PT Goal: Perform Home Exercise Program - Progress Progressing toward goal   Weldon Picking PT Acute Rehab Services [phone] Beeper [phone]

## 2011-06-25 NOTE — Progress Notes (Signed)
Patient ID: [name], female   DOB: 01/15/1946, 66 y.o.   MRN: [id number] PATIENT ID: [name]        MRN:  [id number]          DOB/AGE: [birth year] / 66 y.o.  [name], MD   Jacqualine Code, PA-C [address] [place], [place], [place]  [address]                             [phone]   PROGRESS NOTE  Subjective:  negative for Chest Pain  negative for Shortness of Breath  positive for Nausea/Vomiting   negative for Calf Pain  negative for Bowel Movement   Tolerating Diet: yes         Patient reports pain as moderate.    Objective: Vital signs in last 24 hours:   Patient Vitals for the past 24 hrs:  BP Temp Temp src Pulse Resp SpO2  06/25/11 0809 121/63 mmHg 98.1 F (36.7 C) - 77  18  97 %  06/24/11 2238 137/61 mmHg 99 F (37.2 C) Oral 61  18  96 %  06/24/11 1521 160/84 mmHg 99 F (37.2 C) - 68  20  94 %    @flow {1959:LAST@   Intake/Output from previous day:   03/07 0701 - 03/08 0700 In: 800 [P.O.:600] Out: 580    Intake/Output this shift:   03/08 0701 - 03/08 1900 In: 120 [P.O.:120] Out: -    Intake/Output      03/07 0701 - 03/08 0700 03/08 0701 - 03/09 0700   P.O. 600 120   Blood     IV Piggyback 200    Total Intake(mL/kg) 800 (9) 120 (1.3)   Emesis/NG output 580    Drains     Total Output 580    Net +220 +120        Urine Occurrence 2 x    Emesis Occurrence  2 x      LABORATORY DATA:  Basename 06/25/11 0510 06/24/11 0525 06/23/11 2130 06/23/11 0530  WBC 7.1 7.2 6.6 7.5  HGB 9.9* 9.9* 10.1* 8.3*  HCT 29.5* 29.2* 29.5* 24.6*  PLT 174 157 162 206    Basename 06/25/11 0916 06/25/11 0510 06/24/11 0525 06/23/11 0530  NA 137 135 137 140  K 4.2 3.9 3.6 4.0  CL 101 100 101 107  CO2 29 28 28 25   BUN 10 9 9 8   CREATININE 0.77 0.76 0.85 0.83  GLUCOSE 123* 114* 111* 130*  CALCIUM 9.3 8.7 8.2* 8.7   Lab Results  Component Value Date   INR 1.09 06/11/2011    Examination:  General appearance: alert, cooperative, moderate distress and  moderately obese Resp: clear to auscultation bilaterally Cardio: regular rate and rhythm GI: normal findings: bowel sounds normal  Wound Exam: clean, dry, intact   Drainage:  None: wound tissue dry  Motor Exam: EHL, FHL, Anterior Tibial and Posterior Tibial Intact  Sensory Exam: Superficial Peroneal, Deep Peroneal and Tibial normal  Vascular Exam: intact   Assessment:    3 Days Post-Op  Procedure(s) (LRB): EXCISIONAL TOTAL KNEE ARTHROPLASTY WITH ANTIBIOTIC SPACERS (Left)  ADDITIONAL DIAGNOSIS:  Principal Problem:  *Painful total knee replacement Active Problems:  Hypertension  Hyperlipidemia  Obesity (BMI 30-39.9)  Acute blood loss anemia  Mild chronic anemia  Acute Blood Loss Anemia and post op nausea and vomiting   Plan: Physical Therapy as ordered Partial Weight Bearing @ 50% (PWB)  DVT Prophylaxis:  Xarelto, Foot Pumps and TED hose  DISCHARGE PLAN: Home  DISCHARGE NEEDS: HHPT, CPM, Walker, 3-in-1 comode seat and IV Antibiotics  Will follow nausea,  Otherwise if symptoms improve, will d/c this afternoon       [facility] 06/25/2011, 10:35 AM

## 2011-06-26 LAB — ANAEROBIC CULTURE

## 2011-06-30 LAB — POCT I-STAT 4, (NA,K, GLUC, HGB,HCT)
Glucose, Bld: 173 mg/dL — ABNORMAL HIGH (ref 70–99)
HCT: 28 % — ABNORMAL LOW (ref 36.0–46.0)
Hemoglobin: 9.5 g/dL — ABNORMAL LOW (ref 12.0–15.0)
Potassium: 4.3 mEq/L (ref 3.5–5.1)

## 2011-07-01 ENCOUNTER — Telehealth: Payer: Self-pay | Admitting: *Deleted

## 2011-07-01 NOTE — Telephone Encounter (Signed)
Delaney Meigs from [facility] [employer] [facility] called with critical labs on patient.  She has not been seen in clinic yet, gave Dr. Ephriam Knuckles pager number.  He saw pt in the hospital. Wendall Mola CMA

## 2011-07-07 ENCOUNTER — Telehealth: Payer: Self-pay | Admitting: Licensed Clinical Social Worker

## 2011-07-07 NOTE — Telephone Encounter (Signed)
A "prognosis" would best be answered by the orthopedist, I can not give a prognosis.  She can follow up sooner if she likes.

## 2011-07-07 NOTE — Telephone Encounter (Signed)
Patient's husband has been calling multiple times wanting Dr. Luciana Axe to call him to talk about the prognosis of his wife's knee infection. We have told him that we can't discuss anything with him because she has not been seen in the clinic yet. Her appointment is not until 08/12/2011. Please advise

## 2011-07-12 ENCOUNTER — Telehealth: Payer: Self-pay | Admitting: *Deleted

## 2011-07-12 NOTE — Telephone Encounter (Signed)
Spouse called asking why his wife's appt was so far off. States she is to be on the antibiotics 4-6 weeks & the appt is 7-8 weeks off.  Also c/o her being on antibiotics when they have not found any bacteria in or on the site. Frustrated that md did not call him back.   Wants md to call him  Use home #  Sent to J. Hopkins, Manager to change the appt to something sooner.  I told him I will call him when the appt has been changed

## 2011-07-12 NOTE — Telephone Encounter (Signed)
I will call patient and bring in the week of 4/18, that's 6 weeks from 3/8, which is when I received request for follow-up appt.  I had scheduled them for 4/25. Thanks Asher Muir

## 2011-07-27 ENCOUNTER — Telehealth: Payer: Self-pay | Admitting: *Deleted

## 2011-07-27 NOTE — Telephone Encounter (Signed)
Patient's husband called, requesting that Dr. Luciana Axe call him when he returns from vacation 08/02/11, with questions that the surgeon is unable to answer.  He has an appointment on 08/05/11, which has already been moved up from 08/12/11.  He is trying to have her scheduled for surgery.  She is still on IV antibiotics and is in a lot of pain.  Tried to explain that Dr. Luciana Axe would answer his questions at her appointment, and surgery would not be scheduled until her infection has been resolved. He is also requesting that the call be made between 5 and 6 pm.

## 2011-08-05 ENCOUNTER — Encounter: Payer: Self-pay | Admitting: Internal Medicine

## 2011-08-05 ENCOUNTER — Ambulatory Visit (INDEPENDENT_AMBULATORY_CARE_PROVIDER_SITE_OTHER): Payer: BC Managed Care – PPO | Admitting: Internal Medicine

## 2011-08-05 VITALS — BP 103/68 | HR 82 | Temp 98.8°F | Wt 192.2 lb

## 2011-08-05 DIAGNOSIS — T8450XA Infection and inflammatory reaction due to unspecified internal joint prosthesis, initial encounter: Secondary | ICD-10-CM

## 2011-08-05 NOTE — Progress Notes (Signed)
Order to remove PICC line obtained from Dr. Luciana Axe. Pt. identified with name and date of birth. PICC dressing removed, site unremarkable. PICC line removed using sterile procedure @ 1145 pm. PICC length equal to that noted in pt's hospital chart of 41 cm. Sterile petroleum gauze + sterile 4X4 applied to PICC site, pressure applied for 10 minutes and covered with Medipore tape as a pressure dressing. Pt. instructed to limit use of arm for 1 hour. Pt. instructed that the pressure dressing should remain in place for 24 hours. Pt. verablized understanding of these instructions.

## 2011-08-06 DIAGNOSIS — T8450XA Infection and inflammatory reaction due to unspecified internal joint prosthesis, initial encounter: Secondary | ICD-10-CM | POA: Insufficient documentation

## 2011-08-06 NOTE — Assessment & Plan Note (Signed)
She has completed empiric therapy for 6 weeks.  No indication to continue.  From an ID standpoint, patient may proceed with further surgery for the two stage procedure.

## 2011-08-06 NOTE — Progress Notes (Signed)
  Subjective:    Patient ID: [name], female    DOB: 1945-08-29, 66 y.o.   MRN: [id number]  HPI [name] is a 66 y.o. female with history of TKA in 2005 and off and on pain since with exacerbation of pain beginning about January this year. Pain worsened with activity. Bone scan with concern for infection or loosening and aspiration with 660 white cells, no crystals. Culture was negative and patient was started on empiric therapy with vancomycin and cipro.  She Had some difficulty with cipro causing nausea and stopped it last week but otherwise has completed 6 weeks of vancomycin.  Labs have been reassuring with a mild elevation of ESR.  No fever or chills.  Continued pain.  Anxious to proceed to surgery.     Review of Systems  Constitutional: Negative for fever, chills and fatigue.  Cardiovascular: Negative for chest pain, palpitations and leg swelling.  Gastrointestinal: Positive for nausea. Negative for vomiting, abdominal pain and diarrhea.  Skin: Negative for rash.  Hematological: Negative for adenopathy.  Psychiatric/Behavioral: Negative for dysphoric mood. The patient is nervous/anxious.        Objective:   Physical Exam  Constitutional: She appears well-developed and well-nourished. No distress.  Cardiovascular: Normal rate, regular rhythm and normal heart sounds.  Exam reveals no gallop and no friction rub.   No murmur heard. Musculoskeletal:       Knee in a brace          Assessment & Plan:

## 2011-08-11 ENCOUNTER — Encounter (HOSPITAL_COMMUNITY): Payer: Self-pay

## 2011-08-11 ENCOUNTER — Encounter (HOSPITAL_COMMUNITY)
Admission: RE | Admit: 2011-08-11 | Discharge: 2011-08-11 | Disposition: A | Discharge status: Home / Self Care | Payer: BC Managed Care – PPO | Source: Ambulatory Visit | Attending: Orthopaedic Surgery | Admitting: Orthopaedic Surgery

## 2011-08-11 HISTORY — DX: Unspecified osteoarthritis, unspecified site: M19.90

## 2011-08-11 HISTORY — DX: Inflammatory liver disease, unspecified: K75.9

## 2011-08-11 HISTORY — DX: Encounter for other specified aftercare: Z51.89

## 2011-08-11 HISTORY — DX: Reserved for inherently not codable concepts without codable children: IMO0001

## 2011-08-11 LAB — CBC
HCT: 29.8 % — ABNORMAL LOW (ref 36.0–46.0)
Hemoglobin: 10.3 g/dL — ABNORMAL LOW (ref 12.0–15.0)
MCHC: 34.6 g/dL (ref 30.0–36.0)
MCV: 95.5 fL (ref 78.0–100.0)
RDW: 12.9 % (ref 11.5–15.5)
WBC: 6.5 10*3/uL (ref 4.0–10.5)

## 2011-08-11 LAB — COMPREHENSIVE METABOLIC PANEL
Alkaline Phosphatase: 129 U/L — ABNORMAL HIGH (ref 39–117)
BUN: 15 mg/dL (ref 6–23)
Chloride: 99 mEq/L (ref 96–112)
Creatinine, Ser: 1.32 mg/dL — ABNORMAL HIGH (ref 0.50–1.10)
GFR calc Af Amer: 48 mL/min — ABNORMAL LOW (ref >90)
Glucose, Bld: 105 mg/dL — ABNORMAL HIGH (ref 70–99)
Potassium: 3.8 mEq/L (ref 3.5–5.1)
Total Bilirubin: 0.8 mg/dL (ref 0.3–1.2)

## 2011-08-11 LAB — PROTIME-INR
INR: 1.04 (ref 0.00–1.49)
Prothrombin Time: 13.8 seconds (ref 11.6–15.2)

## 2011-08-11 LAB — URINE MICROSCOPIC-ADD ON

## 2011-08-11 LAB — SURGICAL PCR SCREEN
MRSA, PCR: NEGATIVE
Staphylococcus aureus: NEGATIVE

## 2011-08-11 LAB — DIFFERENTIAL
Basophils Absolute: 0 10*3/uL (ref 0.0–0.1)
Basophils Relative: 0 % (ref 0–1)
Eosinophils Absolute: 0.1 10*3/uL (ref 0.0–0.7)
Neutro Abs: 4.7 10*3/uL (ref 1.7–7.7)
Neutrophils Relative %: 72 % (ref 43–77)

## 2011-08-11 LAB — URINALYSIS, ROUTINE W REFLEX MICROSCOPIC
Bilirubin Urine: NEGATIVE
Ketones, ur: NEGATIVE mg/dL
Nitrite: NEGATIVE
Protein, ur: NEGATIVE mg/dL
Urobilinogen, UA: 1 mg/dL (ref 0.0–1.0)

## 2011-08-11 LAB — APTT: aPTT: 30 seconds (ref 24–37)

## 2011-08-11 MED ORDER — CHLORHEXIDINE GLUCONATE 4 % EX LIQD: 60.0000 mL | Freq: Once | CUTANEOUS | Status: DC

## 2011-08-11 MED ORDER — CHLORHEXIDINE GLUCONATE 4 % EX LIQD: 60.0000 mL | Freq: Every day | CUTANEOUS | Status: DC

## 2011-08-11 NOTE — Pre-Procedure Instructions (Addendum)
20 [name]  08/11/2011   Your procedure is scheduled on: 4.30.13  Report to [facility] at 1120AM.  Call this number if you have problems the morning of surgery: [phone]   Remember:   Do not eat food:After Midnight.  May have clear liquids: up to 4 Hours before arrival. 720 am   Clear liquids include soda, tea, black coffee, apple or grape juice, broth.  Take these medicines the morning of surgery with A SIP OF WATER:nebivolol  STOP aspirin, any nsaids, herbal meds , blood thinners   Do not wear jewelry, make-up or nail polish.  Do not wear lotions, powders, or perfumes. You may wear deodorant.  Do not shave 48 hours prior to surgery.  Do not bring valuables to the hospital.  Contacts, dentures or bridgework may not be worn into surgery.  Leave suitcase in the car. After surgery it may be brought to your room.  For patients admitted to the hospital, checkout time is 11:00 AM the day of discharge.   Patients discharged the day of surgery will not be allowed to drive home.  Name and phone number of your driver:   Special Instructions: Incentive Spirometry - Practice and bring it with you on the day of surgery. and CHG Shower Use Special Wash: 1/2 bottle night before surgery and 1/2 bottle morning of surgery.   Please read over the following fact sheets that you were given: Pain Booklet, Coughing and Deep Breathing, Blood Transfusion Information, Total Joint Packet, MRSA Information and Surgical Site Infection Prevention

## 2011-08-11 NOTE — H&P (Signed)
[name], MD   Jacqualine Code, PA-C [address] [place], [place], [place]  [address]                             [phone]   ORTHOPAEDIC HISTORY & PHYSICAL  [name] MRN:  [id number] DOB/SEX:  [birth year]/female  CHIEF COMPLAINT:  Painful left Knee  HISTORY:  [name] is a very pleasant 66 year old white female who is seen back today for follow up of her left knee surgery. Her previous history is that she had a left total knee arthroplasty performed by Dr. Priscille Kluver in 2005. She states she has had pain off and on in the left knee since her surgery, but this began to exacerbate in January of this year. She had gotten to the point where her pain was so severe and sharp that she was having difficulties tolerating the pain. It was of insidious onset recently such that caused her even to leave work. She went home and rested the knee and finally had improvement initially. When she is up and active the pain worsens. When she is at rest the pain is tolerable. She denies any recent history of injury or trauma to the knee. She has been using Advil intermittently throughout the day. She denies any fevers, shakes, or chills. Rest has been helpful, but activity again worsens her symptoms. We had sent her off for a bone scan as well as laboratory studies and an aspiration was performed and revealed:  LABS:  1. Laboratory studies of the blood reveals a white count of 7,400. She does have some anemia with hemoglobin of 11.2. Sedimentation rate was 30. CRP was .74.  2. Aspiration revealed yellow, cloudy fluid with 660 white cells, 38 neutrophils, 54 lymphocytes, 7 monocytes, 1 eosinophil. There were no crystals noted. Glucose was 82. Protein was 4.1.  3. Cultures revealed no growth x 3 days. BONE SCAN: Three phase bone scan reveals asymmetric increased uptake around the left knee prosthesis that could be due to infection or loosening. This was around all three components.   She had failed  conservative treatment and underwent the removal of prosthesis and  antibiotic spacer implantation. She has had antibiotics for 6 weeks.  Cleared by Dr Luciana Axe of ID for reimplantation.  Admitted for antibx spacer removal and revision TKA.   PAST MEDICAL HISTORY: Patient Active Problem List  Diagnoses Date Noted  . Painful total knee replacement 06/23/2011    Priority: High  . 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 Hypertension 06/23/2011  . Hyperlipidemia 06/23/2011  . Obesity (BMI 30-39.9) 06/23/2011  . Acute blood loss anemia 06/23/2011  . Mild chronic anemia 06/23/2011   Past Medical History  Diagnosis Date  . Hypertension   . Hyperlipemia   . PONV (postoperative nausea and vomiting)     scop patch before prior surgeries  . Blood transfusion   . 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 Arthritis    Past Surgical History  Procedure Date  . Appendectomy 1974  . Abdominal hysterectomy 1995  . Cholecystectomy 1996  . Colonoscopy   . Joint replacement     lft knee 2005, rt knee 2006  . Knee arthroscopy     spacer on lft knee  . Rectal surgery     fissure     MEDICATIONS:   No prescriptions prior to admission    ALLERGIES:  No Known Allergies  REVIEW  OF SYSTEMS:  A comprehensive review of systems was negative.   FAMILY HISTORY:  No family history on file.  SOCIAL HISTORY:   History  Substance Use Topics  . Smoking status: Never Smoker   . Smokeless tobacco: Not on file  . Alcohol Use: No      EXAMINATION: Vital signs in last 24 hours:    Head is normocephalic. Eyes: pupils equal and round and react to light and accommodation. Extraocular motions are intact.  Ears, nose and throat were benign. Neck was supple; no bruits. Chest had good expansion. Lungs were clear to auscultation. Cardiac had a regular rhythm and rate, normal S1-S2. No murmurs noted.  Abdomen is obese, soft, nontender. No masses palpable. Normal bowel sounds  present. Genital, rectal and breast exam not indicated for orthopedic evaluation. CNS oriented x3 and cranial nerves II through XII grossly intact. Her skin is intact. Musculoskeletal: today the left knee reveals range of motion from 20-60 degrees. She does have a positive effusion. There is crepitance with range of motion of the left knee. Diffuse tenderness about the knee.  Neurovascularly intact distally.   ASSESSMENT: S/P REMOVAL OF TKR AND PLACEMENT OF ANTIBIOTIC SPACER LEFT KNEE  Past Medical History  Diagnosis Date  . Hypertension   . Hyperlipemia   . PONV (postoperative nausea and vomiting)     scop patch before prior surgeries  . Blood transfusion   . 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 Arthritis     PLAN: Plan for left removal of antibiotic spacer and revision total knee replacement.  Oris Drone Aleda Grana [facility] [phone]  08/17/2011 9:55 AM

## 2011-08-12 ENCOUNTER — Inpatient Hospital Stay: Payer: BC Managed Care – PPO | Admitting: Internal Medicine

## 2011-08-12 LAB — URINE CULTURE: Culture  Setup Time: 201304241508

## 2011-08-12 NOTE — Consult Note (Signed)
Anesthesia Chart Review:  Patient is a 66 year old female scheduled for removal antibiotic spacer left knee with revision of TKR on 08/17/11.  She is s/p excisional TKA with antibiotic spacers on 06/22/11.  Other history includes post-operative N/V, HTN, non-smoker, arthritis, HLD, Hepatitis ("A" according to nursing assessment).   EKG 06/11/11 showed SB.  CXR from 06/11/11 showed: Findings compatible with prior granulomatous disease. No new focal worrisome or acute abnormalities seen.  Labs reviewed.  Cr 1.32.  Alk phos elevated but stable at 129.  AST/ALT WNL.  Coags WNL. H/H decreased but rising at 10.3/29.8.  Will order T&S for the day of surgery.  Plan to proceed.  Shonna Chock, PA-C

## 2011-08-17 ENCOUNTER — Ambulatory Visit (HOSPITAL_COMMUNITY): Payer: BC Managed Care – PPO | Admitting: Vascular Surgery

## 2011-08-17 ENCOUNTER — Encounter (HOSPITAL_COMMUNITY): Payer: Self-pay | Admitting: Vascular Surgery

## 2011-08-17 ENCOUNTER — Encounter (HOSPITAL_COMMUNITY)
Admission: RE | Disposition: A | Discharge status: Home Health | Payer: Self-pay | Source: Ambulatory Visit | Attending: Orthopaedic Surgery

## 2011-08-17 DIAGNOSIS — L270 Generalized skin eruption due to drugs and medicaments taken internally: Secondary | ICD-10-CM | POA: Diagnosis not present

## 2011-08-17 DIAGNOSIS — D62 Acute posthemorrhagic anemia: Secondary | ICD-10-CM | POA: Diagnosis not present

## 2011-08-17 DIAGNOSIS — Y9210 Unspecified residential institution as the place of occurrence of the external cause: Secondary | ICD-10-CM | POA: Diagnosis not present

## 2011-08-17 DIAGNOSIS — Z9089 Acquired absence of other organs: Secondary | ICD-10-CM

## 2011-08-17 DIAGNOSIS — T8489XA Other specified complication of internal orthopedic prosthetic devices, implants and grafts, initial encounter: Secondary | ICD-10-CM | POA: Diagnosis present

## 2011-08-17 DIAGNOSIS — M25569 Pain in unspecified knee: Secondary | ICD-10-CM | POA: Diagnosis present

## 2011-08-17 DIAGNOSIS — M21869 Other specified acquired deformities of unspecified lower leg: Secondary | ICD-10-CM | POA: Diagnosis present

## 2011-08-17 DIAGNOSIS — Z96659 Presence of unspecified artificial knee joint: Secondary | ICD-10-CM

## 2011-08-17 DIAGNOSIS — T8450XA Infection and inflammatory reaction due to unspecified internal joint prosthesis, initial encounter: Principal | ICD-10-CM | POA: Diagnosis present

## 2011-08-17 DIAGNOSIS — Z90710 Acquired absence of both cervix and uterus: Secondary | ICD-10-CM

## 2011-08-17 DIAGNOSIS — E785 Hyperlipidemia, unspecified: Secondary | ICD-10-CM | POA: Diagnosis present

## 2011-08-17 DIAGNOSIS — T3695XA Adverse effect of unspecified systemic antibiotic, initial encounter: Secondary | ICD-10-CM | POA: Diagnosis not present

## 2011-08-17 DIAGNOSIS — I10 Essential (primary) hypertension: Secondary | ICD-10-CM | POA: Diagnosis present

## 2011-08-17 DIAGNOSIS — E669 Obesity, unspecified: Secondary | ICD-10-CM | POA: Diagnosis present

## 2011-08-17 DIAGNOSIS — B159 Hepatitis A without hepatic coma: Secondary | ICD-10-CM | POA: Diagnosis present

## 2011-08-17 DIAGNOSIS — Y838 Other surgical procedures as the cause of abnormal reaction of the patient, or of later complication, without mention of misadventure at the time of the procedure: Secondary | ICD-10-CM | POA: Diagnosis present

## 2011-08-17 HISTORY — PX: TOTAL KNEE REVISION: SHX996

## 2011-08-17 SURGERY — TOTAL KNEE REVISION
Anesthesia: Regional | Site: Knee | Laterality: Left | Wound class: Clean

## 2011-08-17 MED ORDER — VANCOMYCIN HCL 1000 MG IV SOLR
1000.0000 mg | INTRAVENOUS | Status: DC | PRN
  Administered 2011-08-17: 1000 mg via INTRAVENOUS

## 2011-08-17 MED ORDER — LIDOCAINE HCL (CARDIAC) 20 MG/ML IV SOLN
INTRAVENOUS | Status: DC | PRN
  Administered 2011-08-17: 50 mg via INTRAVENOUS

## 2011-08-17 MED ORDER — MIDAZOLAM HCL 5 MG/5ML IJ SOLN
INTRAMUSCULAR | Status: DC | PRN
  Administered 2011-08-17: 2 mg via INTRAVENOUS

## 2011-08-17 MED ORDER — ACETAMINOPHEN 10 MG/ML IV SOLN
1000.0000 mg | Freq: Four times a day (QID) | INTRAVENOUS | Status: AC
  Administered 2011-08-17 – 2011-08-18 (×3): 1000 mg via INTRAVENOUS
  Filled 2011-08-17 (×4): qty 100

## 2011-08-17 MED ORDER — MENTHOL 3 MG MT LOZG: 1.0000 | LOZENGE | OROMUCOSAL | Status: DC | PRN

## 2011-08-17 MED ORDER — METHOCARBAMOL 100 MG/ML IJ SOLN
500.0000 mg | Freq: Four times a day (QID) | INTRAVENOUS | Status: DC | PRN
  Filled 2011-08-17: qty 5

## 2011-08-17 MED ORDER — PROPOFOL 10 MG/ML IV EMUL
INTRAVENOUS | Status: DC | PRN
  Administered 2011-08-17: 120 mg via INTRAVENOUS

## 2011-08-17 MED ORDER — BUPIVACAINE-EPINEPHRINE PF 0.25-1:200000 % IJ SOLN
INTRAMUSCULAR | Status: DC | PRN
  Administered 2011-08-17: 30 mL

## 2011-08-17 MED ORDER — SODIUM CHLORIDE 0.9 % IV SOLN: INTRAVENOUS | Status: DC

## 2011-08-17 MED ORDER — VANCOMYCIN HCL IN DEXTROSE 1-5 GM/200ML-% IV SOLN
INTRAVENOUS | Status: AC
  Filled 2011-08-17: qty 200

## 2011-08-17 MED ORDER — MORPHINE SULFATE 10 MG/ML IJ SOLN
INTRAMUSCULAR | Status: DC | PRN
  Administered 2011-08-17 (×2): 4 mg via INTRAVENOUS

## 2011-08-17 MED ORDER — RIVAROXABAN 10 MG PO TABS
10.0000 mg | ORAL_TABLET | ORAL | Status: DC
  Administered 2011-08-18 – 2011-08-19 (×2): 10 mg via ORAL
  Filled 2011-08-17 (×5): qty 1

## 2011-08-17 MED ORDER — FOLIC ACID 1 MG PO TABS
1.0000 mg | ORAL_TABLET | Freq: Every day | ORAL | Status: DC
  Administered 2011-08-18 – 2011-08-20 (×3): 1 mg via ORAL
  Filled 2011-08-17 (×3): qty 1

## 2011-08-17 MED ORDER — ROCURONIUM BROMIDE 100 MG/10ML IV SOLN
INTRAVENOUS | Status: DC | PRN
  Administered 2011-08-17: 50 mg via INTRAVENOUS

## 2011-08-17 MED ORDER — METOCLOPRAMIDE HCL 5 MG/ML IJ SOLN: 5.0000 mg | Freq: Three times a day (TID) | INTRAMUSCULAR | Status: DC | PRN

## 2011-08-17 MED ORDER — DROPERIDOL 2.5 MG/ML IJ SOLN
INTRAMUSCULAR | Status: DC | PRN
  Administered 2011-08-17: 0.625 mg via INTRAVENOUS

## 2011-08-17 MED ORDER — PHENYLEPHRINE HCL 10 MG/ML IJ SOLN
INTRAMUSCULAR | Status: DC | PRN
  Administered 2011-08-17: 40 ug via INTRAVENOUS

## 2011-08-17 MED ORDER — FOLIC ACID 800 MCG PO TABS: 800.0000 ug | ORAL_TABLET | Freq: Every day | ORAL | Status: DC

## 2011-08-17 MED ORDER — HYDROMORPHONE HCL PF 1 MG/ML IJ SOLN
0.2500 mg | INTRAMUSCULAR | Status: DC | PRN
  Administered 2011-08-17 (×2): 0.5 mg via INTRAVENOUS

## 2011-08-17 MED ORDER — NIACIN ER 250 MG PO CPCR
250.0000 mg | ORAL_CAPSULE | Freq: Every day | ORAL | Status: DC
  Administered 2011-08-17 – 2011-08-20 (×4): 250 mg via ORAL
  Filled 2011-08-17 (×4): qty 1

## 2011-08-17 MED ORDER — KETOROLAC TROMETHAMINE 15 MG/ML IJ SOLN
15.0000 mg | Freq: Four times a day (QID) | INTRAMUSCULAR | Status: AC
  Administered 2011-08-17 (×2): 15 mg via INTRAVENOUS
  Filled 2011-08-17 (×3): qty 1

## 2011-08-17 MED ORDER — METHOCARBAMOL 500 MG PO TABS
500.0000 mg | ORAL_TABLET | Freq: Four times a day (QID) | ORAL | Status: DC | PRN
  Administered 2011-08-18 – 2011-08-20 (×6): 500 mg via ORAL
  Filled 2011-08-17 (×6): qty 1

## 2011-08-17 MED ORDER — FENTANYL CITRATE 0.05 MG/ML IJ SOLN
INTRAMUSCULAR | Status: DC | PRN
  Administered 2011-08-17: 50 ug via INTRAVENOUS
  Administered 2011-08-17: 150 ug via INTRAVENOUS
  Administered 2011-08-17 (×2): 50 ug via INTRAVENOUS

## 2011-08-17 MED ORDER — ONDANSETRON HCL 4 MG/2ML IJ SOLN
INTRAMUSCULAR | Status: DC | PRN
  Administered 2011-08-17: 4 mg via INTRAVENOUS

## 2011-08-17 MED ORDER — METHOCARBAMOL 100 MG/ML IJ SOLN
500.0000 mg | Freq: Once | INTRAVENOUS | Status: AC
  Administered 2011-08-17: 500 mg via INTRAVENOUS
  Filled 2011-08-17: qty 5

## 2011-08-17 MED ORDER — SODIUM CHLORIDE 0.9 % IR SOLN
Status: DC | PRN
  Administered 2011-08-17: 1000 mL

## 2011-08-17 MED ORDER — PHENOL 1.4 % MT LIQD: 1.0000 | OROMUCOSAL | Status: DC | PRN

## 2011-08-17 MED ORDER — OXYCODONE HCL 5 MG PO TABS
5.0000 mg | ORAL_TABLET | ORAL | Status: DC | PRN
  Administered 2011-08-18 – 2011-08-20 (×10): 10 mg via ORAL
  Filled 2011-08-17 (×11): qty 2

## 2011-08-17 MED ORDER — ACETAMINOPHEN 10 MG/ML IV SOLN
INTRAVENOUS | Status: AC
  Filled 2011-08-17: qty 100

## 2011-08-17 MED ORDER — SODIUM CHLORIDE 0.9 % IV SOLN
75.0000 mL/h | INTRAVENOUS | Status: DC
  Administered 2011-08-17: 75 mL/h via INTRAVENOUS

## 2011-08-17 MED ORDER — HYDROMORPHONE HCL PF 1 MG/ML IJ SOLN
0.5000 mg | INTRAMUSCULAR | Status: DC | PRN
  Administered 2011-08-18 – 2011-08-19 (×2): 1 mg via INTRAVENOUS
  Filled 2011-08-17 (×2): qty 1

## 2011-08-17 MED ORDER — GLYCOPYRROLATE 0.2 MG/ML IJ SOLN
INTRAMUSCULAR | Status: DC | PRN
  Administered 2011-08-17: .6 mg via INTRAVENOUS

## 2011-08-17 MED ORDER — NIACIN ER 500 MG PO TBCR: 250.0000 mg | EXTENDED_RELEASE_TABLET | Freq: Every day | ORAL | Status: DC

## 2011-08-17 MED ORDER — VANCOMYCIN HCL IN DEXTROSE 1-5 GM/200ML-% IV SOLN
1000.0000 mg | Freq: Two times a day (BID) | INTRAVENOUS | Status: AC
  Administered 2011-08-18: 1000 mg via INTRAVENOUS
  Filled 2011-08-17: qty 200

## 2011-08-17 MED ORDER — LISINOPRIL 10 MG PO TABS
10.0000 mg | ORAL_TABLET | Freq: Every day | ORAL | Status: DC
  Administered 2011-08-18 – 2011-08-20 (×3): 10 mg via ORAL
  Filled 2011-08-17 (×4): qty 1

## 2011-08-17 MED ORDER — DEXTROSE 5 % IV SOLN
INTRAVENOUS | Status: DC | PRN
  Administered 2011-08-17: 12:00:00 via INTRAVENOUS

## 2011-08-17 MED ORDER — NEBIVOLOL HCL 10 MG PO TABS
10.0000 mg | ORAL_TABLET | Freq: Every day | ORAL | Status: DC
  Administered 2011-08-18 – 2011-08-19 (×2): 10 mg via ORAL
  Filled 2011-08-17 (×3): qty 1

## 2011-08-17 MED ORDER — DEXAMETHASONE SODIUM PHOSPHATE 4 MG/ML IJ SOLN
INTRAMUSCULAR | Status: DC | PRN
  Administered 2011-08-17: 8 mg via INTRAVENOUS

## 2011-08-17 MED ORDER — METOCLOPRAMIDE HCL 10 MG PO TABS: 5.0000 mg | ORAL_TABLET | Freq: Three times a day (TID) | ORAL | Status: DC | PRN

## 2011-08-17 MED ORDER — ONDANSETRON HCL 4 MG/2ML IJ SOLN: 4.0000 mg | Freq: Four times a day (QID) | INTRAMUSCULAR | Status: DC | PRN

## 2011-08-17 MED ORDER — TOBRAMYCIN SULFATE 1.2 G IJ SOLR
INTRAMUSCULAR | Status: DC | PRN
  Administered 2011-08-17: 1.2 g

## 2011-08-17 MED ORDER — ONDANSETRON HCL 4 MG PO TABS
4.0000 mg | ORAL_TABLET | Freq: Four times a day (QID) | ORAL | Status: DC | PRN
  Administered 2011-08-19: 4 mg via ORAL
  Filled 2011-08-17: qty 1

## 2011-08-17 MED ORDER — ZOLPIDEM TARTRATE 5 MG PO TABS: 5.0000 mg | ORAL_TABLET | Freq: Every evening | ORAL | Status: DC | PRN

## 2011-08-17 MED ORDER — LACTATED RINGERS IV SOLN
INTRAVENOUS | Status: DC | PRN
  Administered 2011-08-17 (×3): via INTRAVENOUS

## 2011-08-17 MED ORDER — NEOSTIGMINE METHYLSULFATE 1 MG/ML IJ SOLN
INTRAMUSCULAR | Status: DC | PRN
  Administered 2011-08-17: 5 mg via INTRAVENOUS

## 2011-08-17 MED ORDER — DOCUSATE SODIUM 100 MG PO CAPS
100.0000 mg | ORAL_CAPSULE | Freq: Two times a day (BID) | ORAL | Status: DC
  Administered 2011-08-17 – 2011-08-20 (×6): 100 mg via ORAL
  Filled 2011-08-17 (×8): qty 1

## 2011-08-17 MED ORDER — ACETAMINOPHEN 10 MG/ML IV SOLN
1000.0000 mg | Freq: Once | INTRAVENOUS | Status: AC
  Administered 2011-08-17: 1000 mg via INTRAVENOUS
  Filled 2011-08-17: qty 100

## 2011-08-17 SURGICAL SUPPLY — 72 items
ADAPTER BOLT FEMORAL +2/-2 (Knees) ×2 IMPLANT
ANCHOR SUPER QUICK (Anchor) ×2 IMPLANT
AUGMENT FEM SZ4 4 LT DIST PFC (Knees) ×4 IMPLANT
AUGMENT POSTERIOR PFC SZ4 4MM (Knees) ×2 IMPLANT
BANDAGE ESMARK 6X9 LF (GAUZE/BANDAGES/DRESSINGS) ×2 IMPLANT
BLADE SAGITTAL 25.0X1.19X90 (BLADE) ×2 IMPLANT
BLADE SAW SAG 73X12.5 (BLADE) ×2 IMPLANT
BNDG ESMARK 6X9 LF (GAUZE/BANDAGES/DRESSINGS) ×4
BOWL SMART MIX CTS (DISPOSABLE) ×2 IMPLANT
BRUSH FEMORAL CANAL (MISCELLANEOUS) ×2 IMPLANT
CEMENT HV SMART SET (Cement) ×6 IMPLANT
CEMENT RESTRICTOR DEPUY SZ 4 (Cement) ×2 IMPLANT
CLEANER TIP ELECTROSURG 2X2 (MISCELLANEOUS) ×2 IMPLANT
CLOTH BEACON ORANGE TIMEOUT ST (SAFETY) ×2 IMPLANT
COMP FEM CEM LT SZ4 (Orthopedic Implant) ×2 IMPLANT
COMPONENT FEM CEM LT SZ4 (Orthopedic Implant) ×1 IMPLANT
COVER SURGICAL LIGHT HANDLE (MISCELLANEOUS) ×2 IMPLANT
CUFF TOURNIQUET SINGLE 34IN LL (TOURNIQUET CUFF) ×2 IMPLANT
CUFF TOURNIQUET SINGLE 44IN (TOURNIQUET CUFF) IMPLANT
DRAPE EXTREMITY T 121X128X90 (DRAPE) ×2 IMPLANT
DRSG ADAPTIC 3X8 NADH LF (GAUZE/BANDAGES/DRESSINGS) ×2 IMPLANT
DRSG PAD ABDOMINAL 8X10 ST (GAUZE/BANDAGES/DRESSINGS) ×4 IMPLANT
DURAPREP 26ML APPLICATOR (WOUND CARE) ×2 IMPLANT
ELECT REM PT RETURN 9FT ADLT (ELECTROSURGICAL) ×2
ELECTRODE REM PT RTRN 9FT ADLT (ELECTROSURGICAL) ×1 IMPLANT
EVACUATOR 1/8 PVC DRAIN (DRAIN) ×2 IMPLANT
FACESHIELD LNG OPTICON STERILE (SAFETY) ×8 IMPLANT
FEMORAL ADAPTER (Orthopedic Implant) ×2 IMPLANT
GLOVE BIOGEL PI IND STRL 8 (GLOVE) ×2 IMPLANT
GLOVE BIOGEL PI IND STRL 8.5 (GLOVE) IMPLANT
GLOVE BIOGEL PI INDICATOR 8 (GLOVE) ×2
GLOVE BIOGEL PI INDICATOR 8.5 (GLOVE)
GLOVE ECLIPSE 8.0 STRL XLNG CF (GLOVE) ×4 IMPLANT
GOWN PREVENTION PLUS XLARGE (GOWN DISPOSABLE) ×4 IMPLANT
GOWN STRL NON-REIN LRG LVL3 (GOWN DISPOSABLE) ×4 IMPLANT
HANDPIECE INTERPULSE COAX TIP (DISPOSABLE) ×1
INSERT RP TC3 4 10MM (Knees) ×1 IMPLANT
INSET RP TC3 4 10MM (Knees) ×2 IMPLANT
KIT BASIN OR (CUSTOM PROCEDURE TRAY) ×2 IMPLANT
KIT ROOM TURNOVER OR (KITS) ×2 IMPLANT
MANIFOLD NEPTUNE II (INSTRUMENTS) ×2 IMPLANT
NEEDLE 22X1 1/2 (OR ONLY) (NEEDLE) ×2 IMPLANT
NS IRRIG 1000ML POUR BTL (IV SOLUTION) ×2 IMPLANT
PACK TOTAL JOINT (CUSTOM PROCEDURE TRAY) ×2 IMPLANT
PAD ARMBOARD 7.5X6 YLW CONV (MISCELLANEOUS) ×4 IMPLANT
PAD CAST 4YDX4 CTTN HI CHSV (CAST SUPPLIES) ×1 IMPLANT
PADDING CAST COTTON 4X4 STRL (CAST SUPPLIES) ×1
PADDING CAST COTTON 6X4 STRL (CAST SUPPLIES) ×2 IMPLANT
PATELLA DOME PFC 38MM (Knees) ×2 IMPLANT
POSTERIOR AUGMENT PFC SZ4 4MM (Knees) ×4 IMPLANT
SET HNDPC FAN SPRY TIP SCT (DISPOSABLE) ×1 IMPLANT
SLEEVE SURGEON STRL (DRAPES) ×2 IMPLANT
SPONGE GAUZE 4X4 12PLY (GAUZE/BANDAGES/DRESSINGS) ×2 IMPLANT
STAPLER VISISTAT 35W (STAPLE) ×4 IMPLANT
STEM TIBIA PFC 13X30MM (Stem) ×2 IMPLANT
STEM UNIVERSAL REVISION 75X16 (Stem) ×2 IMPLANT
SUCTION FRAZIER TIP 10 FR DISP (SUCTIONS) ×2 IMPLANT
SUT BONE WAX W31G (SUTURE) ×2 IMPLANT
SUT ETHIBOND NAB CT1 #1 30IN (SUTURE) ×10 IMPLANT
SUT MON AB 2-0 CT1 36 (SUTURE) ×2 IMPLANT
SUT VIC AB 0 CT1 27 (SUTURE) ×1
SUT VIC AB 0 CT1 27XBRD ANBCTR (SUTURE) ×1 IMPLANT
SUT VIC AB 2-0 FS1 27 (SUTURE) ×4 IMPLANT
SWAB COLLECTION DEVICE MRSA (MISCELLANEOUS) ×2 IMPLANT
SYR CONTROL 10ML LL (SYRINGE) ×2 IMPLANT
TOWER CARTRIDGE SMART MIX (DISPOSABLE) ×2 IMPLANT
TRAY FOLEY CATH 14FR (SET/KITS/TRAYS/PACK) ×2 IMPLANT
TRAY REVISION SZ 3 (Knees) ×2 IMPLANT
TRAY SLEEVE CEM ML (Knees) ×2 IMPLANT
TUBE ANAEROBIC SPECIMEN COL (MISCELLANEOUS) ×2 IMPLANT
WATER STERILE IRR 1000ML POUR (IV SOLUTION) ×6 IMPLANT
YANKAUER SUCT BULB TIP NO VENT (SUCTIONS) ×2 IMPLANT

## 2011-08-17 NOTE — Transfer of Care (Signed)
Immediate Anesthesia Transfer of Care Note  Patient: [name]  Procedure(s) Performed: Procedure(s) (LRB): TOTAL KNEE REVISION (Left)  Patient Location: PACU  Anesthesia Type: General  Level of Consciousness: awake, alert  and oriented  Airway & Oxygen Therapy: Patient Spontanous Breathing and Patient connected to face mask oxygen  Post-op Assessment: Report given to PACU RN and Post -op Vital signs reviewed and stable  Post vital signs: Reviewed and stable  Complications: No apparent anesthesia complications

## 2011-08-17 NOTE — Progress Notes (Signed)
Patient ID: [name], female   DOB: 12-20-1945, 66 y.o.   MRN: [id number] There has been no change in health status since  the current H&P.I have examined the patient and discussed the surgery. No contraindications to the planned procedure exist.

## 2011-08-17 NOTE — Plan of Care (Signed)
Problem: Consults Goal: Diagnosis- Total Joint Replacement Revision Total Knee     

## 2011-08-17 NOTE — Anesthesia Postprocedure Evaluation (Signed)
  Anesthesia Post-op Note  Patient: ELONA YINGER  Procedure(s) Performed: Procedure(s) (LRB): TOTAL KNEE REVISION (Left)  Patient Location: PACU  Anesthesia Type: GA combined with regional for post-op pain  Level of Consciousness: awake  Airway and Oxygen Therapy: Patient Spontanous Breathing and Patient connected to nasal cannula oxygen  Post-op Pain: mild  Post-op Assessment: Post-op Vital signs reviewed, Patient's Cardiovascular Status Stable, Respiratory Function Stable, Patent Airway and No signs of Nausea or vomiting  Post-op Vital Signs: Reviewed and stable  Complications: No apparent anesthesia complications

## 2011-08-17 NOTE — Anesthesia Procedure Notes (Signed)
Procedure Name: Intubation Date/Time: 08/17/2011 12:18 PM Performed by: Kiren Mcisaac S Pre-anesthesia Checklist: Patient identified, Emergency Drugs available, Suction available, Patient being monitored and Timeout performed Patient Re-evaluated:Patient Re-evaluated prior to inductionOxygen Delivery Method: Circle system utilized Preoxygenation: Pre-oxygenation with 100% oxygen Intubation Type: IV induction Ventilation: Mask ventilation without difficulty Laryngoscope Size: Mac and 3 Grade View: Grade I Tube type: Oral Tube size: 7.0 mm Number of attempts: 1 Airway Equipment and Method: Stylet Placement Confirmation: ETT inserted through vocal cords under direct vision,  positive ETCO2 and breath sounds checked- equal and bilateral Secured at: 21 cm Tube secured with: Tape Dental Injury: Teeth and Oropharynx as per pre-operative assessment

## 2011-08-17 NOTE — Op Note (Signed)
[name], [name]                 ACCOUNT NO.:  [id number]  MEDICAL RECORD NO.:  [id number]  LOCATION:  5032                         FACILITY:  [facility]  PHYSICIAN:  Claude Manges. Gissella Niblack, M.D.DATE OF BIRTH:  [birth year]  DATE OF PROCEDURE:  08/17/2011 DATE OF DISCHARGE:                              OPERATIVE REPORT   PREOPERATIVE DIAGNOSIS:  Infected left total knee replacement, status post irrigation and debridement, removal of components and insertion of antibiotic spacer.  POSTOPERATIVE DIAGNOSIS:  Infected left total knee replacement, status post irrigation and debridement, removal of components and insertion of antibiotic spacer.  PROCEDURE:  Exploration of left knee with irrigation and debridement, removal of antibiotic spacer and revision total knee arthroplasty.  SURGEON:  Claude Manges. Cleophas Dunker, MD  ASSISTANT:  Oris Drone. Petrarca, PA-C  ANESTHESIA:  Femoral nerve block with general.  COMPLICATIONS:  None.  COMPONENTS:  Size 3 rotating MBT revision tibial component with a 75-mm x 16-mm universal fluted stem and tibial sleeve.  A #4 TC3 Sigma femoral component with 4-mm medial and lateral distal and anterior augments and a [name] oval dome polyethylene 3 peg patella 38 mm in diameter.  All components were secured with polymethyl methacrylate with added tobramycin.  PROCEDURE:  [name] was met in the holding area.  She did receive a preoperative femoral nerve block to the left lower extremity, identified by Anesthesia.  I did identify the left leg as appropriate operative site.  The patient was then transported to room 1 and placed under general anesthesia without difficulty.  Nursing staff inserted a Foley catheter, urine was clear.  Tourniquet was applied to the left thigh.  The leg was then prepped with Betadine scrub and the DuraPrep from the tourniquet to the midfoot. Sterile draping was performed.  With the extremity elevated was Esmarch exsanguinated with the  proximal tourniquet at 350 mmHg.  The previously utilized midline longitudinal incision was opened with a #10 blade and sharp dissection was carried down to the subcutaneous tissue.  The first layer of capsule was incised in the midline and medial parapatellar incision was then made through the deep capsule, in lined with the previous Ethibond suture.  The capsule was quite thick. The joint was then entered, there was minimal clear effusion.  With some difficulty, I was able to evert the patella and then flexed the knee about 60 degrees at that point.  She only had about 70 degrees preoperatively as she becomes stiff over the past 6 weeks.  I was able to remove the femoral component made with polymethyl methacrylate and added antibiotics using an osteotome without any difficulty.  We also removed the tibial component in the same fashion.  There was a polyethylene rotating platform that was removed by excising its stem and then removing it in one piece.  It was somewhat tedious to remove the polymethyl methacrylate from the central tibial hole, but there were no problems or violation of the tibial cortex.  When all the methacrylate was removed from both the tibia and the femur, the joint was then irrigated.  We proceeded with a revision total knee replacement.  I did send some specimens of deep synovium for  culture and sensitivity.  We initially performed a clean-up transverse cut on the proximal tibia using 0-degree cutting guide.  I aligned this with the external guide so that it was lined up with the normal anatomic axis.  Reaming was performed of the tibia to 15 mm.  At that point, we sized a #4 tibial tray to this we applied the 75 x 16-mm fluted stem.  There was a void in the central portion of the tibia and accordingly we used a tibial sleeve,  With that combination, I had a very nice fit.  There was some bone void in the very posterior medial aspect of the tibia, but I  felt that we could supplement this with methacrylate approximately 75-80% of the component set on bone.  We then prepared the femur.  The center hole was made.  The cutting jigs were applied corresponded to a #4 femur.  We measured 4-mm anterior and posterior augments bilaterally.  I hand reamed to 16 mm.  At that point, the box cut was made and we trialed the #4 femoral TC3 component with the augments and felt we had an excellent construct, it was nicely juxtaposed to bone and there was no toggling within the femoral shaft.  We then trialed all of the components using a 10-mm bridging bearing and we had full extension, flexed at least to 100 degrees and there was no opening with the varus or valgus stress.  I prepared the patella for the polyethylene patellar button by removing enough bone so that the surface was flat.  I then use the patellar jig and made the three holes and the patella fit very nicely.  I did perform a lateral release as it was tight with flexion and extension.  After the lateral release, the patella tracked in the midline with the clamps applied to the capsule proximal and distal to the patella.  The trial components were then removed.  The final components were secured on the side table.  We irrigated the wound with saline and removed, infiltrated the deep capsule with 0.25% Marcaine with epinephrine, then removed the prior Ethibond suture.  We then inserted the final components with the polymethyl methacrylate with added tobramycin.  We initially inserted the tibia and fit nicely.  The tibial component fit nicely in the canal with methacrylate throughout the entire length of the stem.  I did use a cement restrictor, which was measured prior to insertion of the tibial component.  Extraneous methacrylate was removed from around the periphery.  The femoral component was then inserted with polymethyl methacrylate with the added tobramycin.  This also fit very  nicely.  We then inserted the 10-mm bridging bearing and then placed the knee in full extension. We had further compression of the components and removed further extraneous methacrylate.  The patella was applied with methacrylate with added tobramycin and the patellar clamp.  At approximately 15 minutes, the methacrylate was hardened, we inspected the joint.  There was no further extraneous methacrylate.  So, we placed the knee through a full range of motion, there was no opening with varus or valgus stress, it was nice and stable anteriorly.  We had full extension, we were able to flex at least 90 degrees.  Tourniquet was deflated at 2 hours and 6 minutes.  We had immediate bleeding to the joint surface.  We applied compression and used the Bovie cautery.  We had a nice dry field at the time of closure.  The Hemovac was  inserted.  Then, I closed the deep capsule with interrupted #1 Ethibond, superficial capsule with 0 Vicryl and 2-0 Vicryl, and then subcu with 3-0 Monocryl.  The skin was closed with skin clips. Sterile bulky dressing was applied followed by the patient's support stocking.  The patient tolerated the procedure without complications.     Claude Manges. Cleophas Dunker, M.D.     PWW/MEDQ  D:  08/17/2011  T:  08/17/2011  Job:  [id number]

## 2011-08-17 NOTE — Progress Notes (Signed)
Orthopedic Tech Progress Note Patient Details:  [name] February 12, 1946 [id number]  CPM Left Knee CPM Left Knee: On Left Knee Flexion (Degrees): 50  Left Knee Extension (Degrees): 0    [name] 08/17/2011, 4:22 PM

## 2011-08-17 NOTE — Preoperative (Signed)
Beta Blockers   Reason not to administer Beta Blockers:Not Applicable, Taken at 6am thia am per patient.

## 2011-08-17 NOTE — Op Note (Signed)
PATIENT ID:      [name]  MRN:     [id number] DOB/AGE:    1945-09-28 / 66 y.o.       OPERATIVE REPORT    DATE OF PROCEDURE:  08/17/2011       PREOPERATIVE DIAGNOSIS: infected left TKR status post removal, I & D, and insertion of antibiotic spacer                                                       Estimated Body mass index is 31.03 kg/(m^2) as calculated from the following:   Height as of 06/22/11: 5\' 6" (1.676 m).   Weight as of 08/05/11: 192 lb 4 oz(87.204 kg).     POSTOPERATIVE DIAGNOSIS:   same                                                                    Estimated Body mass index is 31.03 kg/(m^2) as calculated from the following:   Height as of 06/22/11: 5\' 6" (1.676 m).   Weight as of 08/05/11: 192 lb 4 oz(87.204 kg).     PROCEDURE:  Procedure(s):REMOVAL OF ANTIBIOTIC SPACER WITH TOTAL KNEE REVISION ARTHROPLASTY     SURGEON: [name]    ASSISTANT:   Crystal Code, PA-C   (Present and scrubbed throughout the case, critical for assistance with exposure, retraction, instrumentation, and closure.)          ANESTHESIA: Regional with general     DRAINS: Penrose drain in the left knee :      TOURNIQUET TIME: 2 hours and 6 minutes   COMPLICATIONS:  None   CONDITION:  stable  PROCEDURE IN DETAIL: dictation [id number]   [name] 08/17/2011, 3:15 PM

## 2011-08-17 NOTE — Anesthesia Preprocedure Evaluation (Addendum)
Anesthesia Evaluation  Patient identified by MRN, date of birth, ID band Patient awake    Reviewed: Allergy & Precautions, H&P , NPO status , Patient's Chart, lab work & pertinent test results, reviewed documented beta blocker date and time   History of Anesthesia Complications (+) PONV  Airway Mallampati: II TM Distance: >3 FB Neck ROM: Full    Dental No notable dental hx. (+) Teeth Intact and Dental Advisory Given   Pulmonary neg pulmonary ROS,  breath sounds clear to auscultation  Pulmonary exam normal       Cardiovascular hypertension, Pt. on home beta blockers and Pt. on medications Rhythm:Regular Rate:Normal     Neuro/Psych negative neurological ROS  negative psych ROS   GI/Hepatic negative GI ROS, (+) Hepatitis -, A  Endo/Other  negative endocrine ROS  Renal/GU negative Renal ROS  negative genitourinary   Musculoskeletal  (+) Arthritis -, Osteoarthritis,    Abdominal   Peds  Hematology negative hematology ROS (+)   Anesthesia Other Findings   Reproductive/Obstetrics negative OB ROS                        Anesthesia Physical Anesthesia Plan  ASA: II  Anesthesia Plan: General   Post-op Pain Management:    Induction: Intravenous  Airway Management Planned: Oral ETT  Additional Equipment:   Intra-op Plan:   Post-operative Plan: Extubation in OR  Informed Consent: I have reviewed the patients History and Physical, chart, labs and discussed the procedure including the risks, benefits and alternatives for the proposed anesthesia with the patient or authorized representative who has indicated his/her understanding and acceptance.   Dental advisory given  Plan Discussed with: CRNA, Anesthesiologist and Surgeon  Anesthesia Plan Comments:        Anesthesia Quick Evaluation

## 2011-08-18 ENCOUNTER — Encounter (HOSPITAL_COMMUNITY): Payer: Self-pay | Admitting: Orthopaedic Surgery

## 2011-08-18 LAB — BASIC METABOLIC PANEL
Chloride: 102 mEq/L (ref 96–112)
GFR calc Af Amer: 46 mL/min — ABNORMAL LOW (ref >90)
GFR calc non Af Amer: 40 mL/min — ABNORMAL LOW (ref >90)
Potassium: 4.8 mEq/L (ref 3.5–5.1)
Sodium: 135 mEq/L (ref 135–145)

## 2011-08-18 LAB — CBC
HCT: 18.7 % — ABNORMAL LOW (ref 36.0–46.0)
HCT: 26.7 % — ABNORMAL LOW (ref 36.0–46.0)
Hemoglobin: 9.2 g/dL — ABNORMAL LOW (ref 12.0–15.0)
MCHC: 35.8 g/dL (ref 30.0–36.0)
MCHC: 34.5 g/dL (ref 30.0–36.0)
Platelets: 175 10*3/uL (ref 150–400)
RBC: 2.97 MIL/uL — ABNORMAL LOW (ref 3.87–5.11)
RDW: 12.8 % (ref 11.5–15.5)
WBC: 6.7 10*3/uL (ref 4.0–10.5)
WBC: 9.8 10*3/uL (ref 4.0–10.5)

## 2011-08-18 NOTE — Progress Notes (Signed)
Occupational Therapy Evaluation Patient Details Name: [name] MRN: [id number] DOB: 1945-07-07 Today's Date: 08/18/2011 Time: 1625-1700 OT Time Calculation (min): 35 min  OT Assessment / Plan / Recommendation Clinical Impression  66 yo s/p L TKA. Pt has ad a history of knee surgeries, with hardware removed this surgery (spacer) and prosthesis applied. Pt will benefit from skilled OT to max indep with ADL and functinal mobility for ADL secondary to deficits listed below. Pt's husband will be able to assist after D/C. Pt needs to complete tub bench transfer tomorrow maintaining PWB status.    OT Assessment  Patient needs continued OT Services    Follow Up Recommendations  No OT follow up    Equipment Recommendations  None recommended by OT    Frequency Min 2X/week    Precautions / Restrictions Precautions Precautions: Knee Restrictions Weight Bearing Restrictions: Yes LLE Weight Bearing: Partial weight bearing LLE Partial Weight Bearing Percentage or Pounds: 50   Pertinent Vitals/Pain Pain controlled    ADL  Eating/Feeding: Performed;Independent Where Assessed - Eating/Feeding: Chair Grooming: Performed;Supervision/safety Where Assessed - Grooming: Standing at [place] Upper Body Bathing: Simulated;Set up Where Assessed - Upper Body Bathing: Sitting, chair Lower Body Bathing: Simulated;Moderate assistance Where Assessed - Lower Body Bathing: Sit to stand from chair;Supported Location manager Dressing: Simulated;Set up Where Assessed - Upper Body Dressing: Sitting, chair Lower Body Dressing: Simulated;Moderate assistance Where Assessed - Lower Body Dressing: Sit to stand from chair Toilet Transfer: Performed;Minimal assistance Toilet Transfer Method: Ambulating Toilet Transfer Equipment: Bedside commode Toileting - Clothing Manipulation: Performed;Independent Where Assessed - Toileting Clothing Manipulation: Standing Toileting - Hygiene: Performed;Independent Where Assessed -  Toileting Hygiene: Sit on 3-in-1 or toilet Tub/Shower Transfer: Not assessed;Other (comment) (will complete tomorrow) Ambulation Related to ADLs: supervision (vc for WBS) ADL Comments: A for LB ADL.    OT Goals Acute Rehab OT Goals OT Goal Formulation: With patient Time For Goal Achievement: 08/25/11 Potential to Achieve Goals: Good ADL Goals Pt Will Perform Tub/Shower Transfer: Tub transfer;with supervision;with caregiver independent in assisting;Ambulation;Transfer tub bench;Maintaining weight bearing status (50%) ADL Goal: Tub/Shower Transfer - Progress: Goal set today  Visit Information  Last OT Received On: 08/18/11 Assistance Needed: +1    Subjective Data   "I've been through so much"   Prior Functioning  Home Living Lives With: Spouse Available Help at Discharge: Family Type of Home: House Home Access: Stairs to enter Secretary/administrator of Steps: 1 Home Layout: One level Bathroom Shower/Tub: Forensic scientist: Standard Bathroom Accessibility: Yes How Accessible: Accessible via walker Home Adaptive Equipment: Bedside commode/3-in-1;Grab bars around toilet;Shower chair with back;Walker - rolling;Straight cane;Wheelchair - manual;Tub transfer bench;Other (comment) (borrowing tub bench) Prior Function Level of Independence: Independent Able to Take Stairs?: Yes Driving: Yes Vocation: Full time employment Communication Communication: No difficulties Dominant Hand: Left    Cognition  Overall Cognitive Status: Appears within functional limits for tasks assessed/performed Arousal/Alertness: Awake/alert Orientation Level: Oriented X4 / Intact Behavior During Session: [facility] for tasks performed    Extremity/Trunk Assessment Right Upper Extremity Assessment RUE ROM/Strength/Tone: Within functional levels Left Upper Extremity Assessment LUE ROM/Strength/Tone: Within functional levels RTrunk Assessment Trunk Assessment: Normal   Mobility Bed  Mobility Bed Mobility: Supine to Sit Supine to Sit: 4: Min assist Details for Bed Mobility Assistance: vc Transfers Transfers: Sit to Stand;Stand to Sit Sit to Stand: 4: Min guard;With upper extremity assist;From chair/3-in-1;From toilet Stand to Sit: To chair/3-in-1;To toilet;4: Min guard Details for Transfer Assistance: vc for safety   Exercise  Balance Balance Balance Assessed: No  End of Session OT - End of Session Equipment Utilized During Treatment: Gait belt Activity Tolerance: Patient tolerated treatment well Patient left: in chair;with call bell/phone within reach;with family/visitor present CPM Left Knee CPM Left Knee: Off   Alphons Burgert,HILLARY 08/18/2011, 5:11 PM [facility], OTR/L  [phone] 08/18/2011

## 2011-08-18 NOTE — Progress Notes (Signed)
Patient ID: GRACEANNA THEISSEN, female   DOB: Jun 05, 1945, 66 y.o.   MRN: [id number] PATIENT ID: JALONI SORBER        MRN:  [id number]          DOB/AGE: 1945-07-13 / 66 y.o.  [name], MD   Jacqualine Code, PA-C [address] [place], [place], [place]  [address]                             [phone]   PROGRESS NOTE  Subjective:  negative for Chest Pain  negative for Shortness of Breath  negative for Nausea/Vomiting   negative for Calf Pain  negative for Bowel Movement   Tolerating Diet: yes         Patient reports pain as mild.    Objective: Vital signs in last 24 hours:   Patient Vitals for the past 24 hrs:  BP Temp Temp src Pulse Resp SpO2  08/18/11 0539 91/54 mmHg 98.2 F (36.8 C) - 77  20  100 %  08/18/11 0143 90/54 mmHg 98.1 F (36.7 C) - 65  18  98 %  08/17/11 2113 99/64 mmHg 97.4 F (36.3 C) - 69  18  99 %  08/17/11 1721 99/54 mmHg 97.5 F (36.4 C) - 66  16  100 %  08/17/11 1653 107/48 mmHg 97.2 F (36.2 C) - 64  14  99 %  08/17/11 1642 - - - 66  11  99 %  08/17/11 1630 104/56 mmHg - - - - -  08/17/11 1627 - - - 65  12  100 %  08/17/11 1615 121/59 mmHg - - 72  14  99 %  08/17/11 1600 134/47 mmHg - - 74  13  100 %  08/17/11 1545 129/81 mmHg 97.8 F (36.6 C) - 84  22  100 %  08/17/11 1117 126/81 mmHg 98.1 F (36.7 C) Oral 63  18  96 %      Intake/Output from previous day:   04/30 0701 - 05/01 0700 In: 3760 [I.V.:3160] Out: 1515 [Urine:1290; Drains:150]   Intake/Output this shift:       Intake/Output      04/30 0701 - 05/01 0700 05/01 0701 - 05/02 0700   I.V. 3160    IV Piggyback 600    Total Intake 3760    Urine 1290    Drains 150    Blood 75    Total Output 1515    Net +2245            LABORATORY DATA:  Basename 08/18/11 0540 08/11/11 1417  WBC 6.7 6.5  HGB 6.7* 10.3*  HCT 18.7* 29.8*  PLT 175 239    Basename 08/18/11 0540 08/11/11 1417  NA 135 136  K 4.8 3.8  CL 102 99  CO2 25 26  BUN 16 15  CREATININE 1.35* 1.32*  GLUCOSE 114*  105*  CALCIUM 8.7 9.6   Lab Results  Component Value Date   INR 1.04 08/11/2011   INR 1.09 06/11/2011    Examination:  General appearance: alert, cooperative and no distress  Wound Exam: clean, dry, intact   Drainage:  150cc from hemovac  Motor Exam: EHL, FHL, Anterior Tibial and Posterior Tibial Intact  Sensory Exam: Superficial Peroneal, Deep Peroneal and Tibial normal  Vascular Exam: Normal  Assessment:    1 Day Post-Op  Procedure(s) (LRB): TOTAL KNEE REVISION (Left)  ADDITIONAL DIAGNOSIS:  Active Problems:  *  No active hospital problems. *   Acute Blood Loss Anemia   Plan: Physical Therapy as ordered Partial Weight Bearing @ 50% (PWB)  DVT Prophylaxis:  Xarelto  DISCHARGE PLAN: Home  DISCHARGE NEEDS: HHPT   will transfuse 2 units blood today, OOB with PT, has groin rash from antibiotics-will use microguard powder per nursing   [facility], Chantavia Bazzle W 08/18/2011, 8:07 AM

## 2011-08-18 NOTE — Evaluation (Signed)
Physical Therapy Evaluation Patient Details Name: [name] MRN: [id number] DOB: [birth year] Today's Date: 08/18/2011 Time: 7846-9629 PT Time Calculation (min): 44 min  PT Assessment / Plan / Recommendation Clinical Impression  pt is s/p L knee redo after spacers for infection. Still with pain and weakness incl limited ROM.  Rec. HHPT.    PT Assessment  Patient needs continued PT services    Follow Up Recommendations  Home health PT;Supervision - Intermittent    Equipment Recommendations  None recommended by PT    Frequency 7X/week    Precautions / Restrictions Precautions Precautions: Knee Restrictions Weight Bearing Restrictions: Yes LLE Weight Bearing: Partial weight bearing LLE Partial Weight Bearing Percentage or Pounds: 50   Pertinent Vitals/Pain      Mobility  Bed Mobility Bed Mobility: Supine to Sit Supine to Sit: 4: Min assist Details for Bed Mobility Assistance: vc Transfers Transfers: Sit to Stand;Stand to Sit Sit to Stand: 4: Min assist;From bed Stand to Sit: 4: Min assist;To chair/3-in-1 Details for Transfer Assistance: vc's for hand placement; minor lifting assist Ambulation/Gait Ambulation/Gait Assistance: 4: Min guard Ambulation Distance (Feet): 50 Feet Assistive device: Rolling walker Ambulation/Gait Assistance Details: vc's for sequencing and best use of RW Gait Pattern: Step-to pattern Stairs: No Wheelchair Mobility Wheelchair Mobility: No    Exercises Total Joint Exercises Ankle Circles/Pumps: AROM;20 reps;Both Quad Sets: AAROM;Both;10 reps;Supine Heel Slides: AAROM;Left;10 reps;Supine Hip ABduction/ADduction: AROM;Left;10 reps;Supine   PT Goals Acute Rehab PT Goals PT Goal Formulation: With patient Time For Goal Achievement: 08/25/11 Potential to Achieve Goals: Good Pt will go Supine/Side to Sit: with supervision PT Goal: Supine/Side to Sit - Progress: Goal set today Pt will go Sit to Stand: with supervision PT Goal: Sit to Stand  - Progress: Goal set today Pt will Transfer Bed to Chair/Chair to Bed: with supervision PT Transfer Goal: Bed to Chair/Chair to Bed - Progress: Goal set today Pt will Ambulate: >150 feet;with supervision;with rolling walker PT Goal: Ambulate - Progress: Goal set today Pt will Perform Home Exercise Program: with supervision, verbal cues required/provided PT Goal: Perform Home Exercise Program - Progress: Goal set today  Visit Information  Last PT Received On: 08/18/11 Assistance Needed: +1    Subjective Data  Subjective: having that spacer was awful Patient Stated Goal: Back home independent   Prior Functioning  Home Living Lives With: Spouse Available Help at Discharge: Family Type of Home: House Home Access: Stairs to enter Secretary/administrator of Steps: 1 Home Layout: One level Bathroom Shower/Tub: Forensic scientist: Standard Home Adaptive Equipment: Bedside commode/3-in-1;Grab bars around toilet;Shower chair with back;Walker - rolling;Straight cane;Wheelchair - manual Prior Function Level of Independence: Independent Able to Take Stairs?: Yes Driving: Yes Vocation: Full time employment Communication Communication: No difficulties    Cognition  Overall Cognitive Status: Appears within functional limits for tasks assessed/performed Arousal/Alertness: Awake/alert Orientation Level: Oriented X4 / Intact Behavior During Session: [facility] for tasks performed    Extremity/Trunk Assessment Right Lower Extremity Assessment RLE ROM/Strength/Tone: Within functional levels Left Lower Extremity Assessment LLE ROM/Strength/Tone: Unable to fully assess;WFL for tasks assessed LLE Sensation: Deficits (decr ROM) Trunk Assessment Trunk Assessment: Normal   Balance Balance Balance Assessed: No  End of Session PT - End of Session Activity Tolerance: Patient tolerated treatment well;Patient limited by pain Patient left: in chair;with call bell/phone within  reach;with family/visitor present Nurse Communication: Mobility status CPM Left Knee CPM Left Knee: Off   [name], [name] 08/18/2011, 4:01 PM  08/18/2011  [name], PT [phone]  [phone] (pager)

## 2011-08-18 NOTE — Progress Notes (Signed)
UR COMPLETED  

## 2011-08-18 NOTE — Progress Notes (Signed)
Lab called with a critical lab value of hemoglobin of 6.7. MD made aware of hemoglobin and ordered a verification lab of the hemoglobin. Order placed by RN. No repeat labs have been drawn at this time.

## 2011-08-19 LAB — CBC
HCT: 23.7 % — ABNORMAL LOW (ref 36.0–46.0)
Hemoglobin: 8.3 g/dL — ABNORMAL LOW (ref 12.0–15.0)
MCH: 31.4 pg (ref 26.0–34.0)
MCV: 89.8 fL (ref 78.0–100.0)
Platelets: 141 10*3/uL — ABNORMAL LOW (ref 150–400)
RBC: 2.64 MIL/uL — ABNORMAL LOW (ref 3.87–5.11)
WBC: 7.3 10*3/uL (ref 4.0–10.5)

## 2011-08-19 LAB — BASIC METABOLIC PANEL
CO2: 25 mEq/L (ref 19–32)
Chloride: 100 mEq/L (ref 96–112)
Creatinine, Ser: 1.24 mg/dL — ABNORMAL HIGH (ref 0.50–1.10)
Glucose, Bld: 109 mg/dL — ABNORMAL HIGH (ref 70–99)

## 2011-08-19 MED ORDER — OXYCODONE HCL 20 MG PO TB12
20.0000 mg | ORAL_TABLET | Freq: Two times a day (BID) | ORAL | Status: DC
  Administered 2011-08-19 – 2011-08-20 (×2): 20 mg via ORAL
  Filled 2011-08-19 (×2): qty 1

## 2011-08-19 NOTE — Progress Notes (Signed)
CARE MANAGEMENT NOTE 08/19/2011  Patient:  [name], [name]   Account Number:  [id number]  Date Initiated:  08/19/2011  Documentation initiated by:  Vance Peper  Subjective/Objective Assessment:   66 yr old female s/p removal of antibiotic spacer with total knee revision arthroplasty.     Action/Plan:   Spoke with patient and family regarding home health. Preoperatively setup with [facility], no changes. Patient has DME.   Anticipated DC Date:  08/20/2011   Anticipated DC Plan:  HOME W HOME HEALTH SERVICES      DC Planning Services  CM consult      [facility] Choice  HOME HEALTH   Choice offered to / List presented to:  C-1 Patient        HH arranged  HH-2 PT      [facility] agency  [facility]   Status of service:  Completed, signed off Discharge Disposition:  HOME W HOME HEALTH SERVICES

## 2011-08-19 NOTE — Progress Notes (Addendum)
Referral received for SNF. Chart reviewed and CSW has spoken with RNCM who indicates that patient is for DC to home with Home Health and DME.  CSW to sign off. Please re-consult if CSW needs arise.  Ariyah Sedlack T. Babygirl Trager, BSW  [phone]  

## 2011-08-19 NOTE — Progress Notes (Signed)
08/19/11 1600  PT Visit Information  Last PT Received On 08/19/11  Assistance Needed +1  PT Time Calculation  PT Start Time 1600  PT Stop Time 1629  PT Time Calculation (min) 29 min  Subjective Data  Subjective I just feel miserable, I didn't think I was going to be able to do even this much  Patient Stated Goal Back home independent  Precautions  Precautions Knee  Restrictions  Weight Bearing Restrictions Yes  LLE Weight Bearing PWB  LLE Partial Weight Bearing Percentage or Pounds 50  Cognition  Overall Cognitive Status Appears within functional limits for tasks assessed/performed  Arousal/Alertness Lethargic  Orientation Level Oriented X4 / Intact  Behavior During Session [facility] for tasks performed  Bed Mobility  Bed Mobility Supine to Sit;Sit to Supine;Sitting - Scoot to Edge of Bed  Supine to Sit 4: Min assist  Sitting - Scoot to [place] of Bed 4: Min guard  Sit to Supine 4: Min assist  Details for Bed Mobility Assistance vc's to reinforce safe technique  Transfers  Transfers Sit to Stand;Stand to Sit  Sit to Stand 4: Min guard;From bed;From toilet  Stand to Sit 4: Min guard;To bed;To toilet  Details for Transfer Assistance vc's for hand placement; minor lifting assist  Ambulation/Gait  Ambulation/Gait Assistance 5: Supervision  Ambulation Distance (Feet) 50 Feet  Assistive device Rolling walker  Ambulation/Gait Assistance Details steady step to gait, but slow and unvariable in speed  Gait Pattern Step-to pattern  Stairs No  Wheelchair Mobility  Wheelchair Mobility No  Balance  Balance Assessed No  Exercises  Exercises Total Joint  Total Joint Exercises  Ankle Circles/Pumps AROM;20 reps;Both  [employer];Both;10 reps;Supine  Heel Slides AAROM;Left;10 reps;Supine  Hip ABduction/ADduction AROM;Left;10 reps;Supine  Short Arc Quad AAROM;10 reps;Supine  Straight Leg Raises AAROM;Left;10 reps;Supine  Knee Flexion Left;10 reps;AAROM;Supine  PT - End of Session    Activity Tolerance Patient tolerated treatment well;Patient limited by pain  Patient left in bed;with call bell/phone within reach;with family/visitor present  Nurse Communication Mobility status  PT - Assessment/Plan  Comments on Treatment Session Still not feeling well.  Basically on track still, but will need to work much harder on Knee flexion.  PT Plan Discharge plan remains appropriate  PT Frequency 7X/week  Follow Up Recommendations Home health PT;Supervision - Intermittent  Equipment Recommended None recommended by PT  Acute Rehab PT Goals  PT Goal Formulation With patient  Time For Goal Achievement 08/25/11  Potential to Achieve Goals Good  PT Goal: Supine/Side to Sit - Progress Progressing toward goal  PT Goal: Sit to Stand - Progress Progressing toward goal  PT Transfer Goal: Bed to Chair/Chair to Bed - Progress Progressing toward goal  PT Goal: Ambulate - Progress Progressing toward goal  PT Goal: Perform Home Exercise Program - Progress Progressing toward goal    08/19/2011  Hartford Bing, PT [phone] (pager)

## 2011-08-19 NOTE — Progress Notes (Signed)
In to see pt for treatment focusing on tub transfer with bench, pt and sister report that pt does not need to practice this, that she knows how to do this due this is her 3rd knee surgery on the same knee. Asked them if there was any self care task they were worried about and pt and sister said no. No other OT needs identified, acute OT will sign off.

## 2011-08-19 NOTE — Progress Notes (Signed)
Physical Therapy Treatment Patient Details Name: [name] MRN: [id number] DOB: 1945/08/13 Today's Date: 08/19/2011 Time: 0930-1000 PT Time Calculation (min): 30 min  PT Assessment / Plan / Recommendation Comments on Treatment Session  Pt is lethargic and looks miserable today, but participated well.  On track for possible D/C tomorrow.    Follow Up Recommendations  Home health PT;Supervision - Intermittent    Equipment Recommendations  None recommended by PT    Frequency 7X/week   Plan Discharge plan remains appropriate    Precautions / Restrictions Precautions Precautions: Knee Restrictions Weight Bearing Restrictions: Yes LLE Weight Bearing: Partial weight bearing LLE Partial Weight Bearing Percentage or Pounds: 50   Pertinent Vitals/Pain 7/10    Mobility  Bed Mobility Bed Mobility: Sit to Supine Sit to Supine: 4: Min assist;Other (comment) (at L LE) Details for Bed Mobility Assistance: vc's for best technique Transfers Transfers: Sit to Stand;Stand to Sit Sit to Stand: 4: Min assist;From chair/3-in-1;From bed Stand to Sit: To toilet;To chair/3-in-1;To bed Details for Transfer Assistance: vc's for hand placement; minor lifting assist Ambulation/Gait Ambulation/Gait Assistance: 5: Supervision Ambulation Distance (Feet): 125 Feet Assistive device: Rolling walker Ambulation/Gait Assistance Details: no cuing needed Gait Pattern: Step-to pattern Stairs: No Wheelchair Mobility Wheelchair Mobility: No    Exercises     PT Goals Acute Rehab PT Goals Time For Goal Achievement: 08/25/11 Potential to Achieve Goals: Good PT Goal: Supine/Side to Sit - Progress: Progressing toward goal PT Goal: Sit to Stand - Progress: Progressing toward goal PT Transfer Goal: Bed to Chair/Chair to Bed - Progress: Progressing toward goal PT Goal: Ambulate - Progress: Progressing toward goal  Visit Information  Last PT Received On: 08/19/11 Assistance Needed: +1    Subjective  Data  Subjective: I just haven't got any sleep Patient Stated Goal: Back home independent   Cognition  Overall Cognitive Status: Appears within functional limits for tasks assessed/performed Arousal/Alertness: Lethargic Orientation Level: Oriented X4 / Intact Behavior During Session: [facility] for tasks performed    Balance  Balance Balance Assessed: No  End of Session CPM Left Knee CPM Left Knee: Off    Dreyson Mishkin, [name] 08/19/2011, 10:16 AM  08/19/2011  Hays Bing, PT [phone] (pager)

## 2011-08-19 NOTE — Progress Notes (Signed)
Patient ID: ANALYCIA KHOKHAR, female   DOB: [birth year], 66 y.o.   MRN: [id number] PATIENT ID: YAMINA LENIS        MRN:  [id number]          DOB/AGE: [birth year] / 66 y.o.  [name], MD   Jacqualine Code, PA-C [address] [place], [place], [place]  [address]                             [phone]   PROGRESS NOTE  Subjective:  negative for Chest Pain  negative for Shortness of Breath  positive for Nausea/Vomiting   negative for Calf Pain  negative for Bowel Movement   Tolerating Diet: no         Patient reports pain as moderate.    Objective: Vital signs in last 24 hours:   Patient Vitals for the past 24 hrs:  BP Temp Pulse Resp SpO2  08/19/11 1342 135/72 mmHg 99.1 F (37.3 C) 84  18  96 %  08/19/11 0622 105/65 mmHg 99 F (37.2 C) 84  19  92 %  08/18/11 2217 150/70 mmHg 99 F (37.2 C) 88  20  94 %      Intake/Output from previous day:   05/01 0701 - 05/02 0700 In: -  Out: 110 [Drains:110]   Intake/Output this shift:   05/02 0701 - 05/02 1900 In: 360 [P.O.:360] Out: -    Intake/Output      05/01 0701 - 05/02 0700 05/02 0701 - 05/03 0700   P.O.  360   I.V. (mL/kg)     IV Piggyback     Total Intake(mL/kg)  360 (4.2)   Urine (mL/kg/hr)     Drains 110    Blood     Total Output 110    Net -110 +360        Urine Occurrence 2 x 3 x      LABORATORY DATA:  Basename 08/19/11 0625 08/18/11 1637 08/18/11 0540  WBC 7.3 9.8 6.7  HGB 8.3* 9.2* 6.7*  HCT 23.7* 26.7* 18.7*  PLT 141* 172 175    Basename 08/19/11 0625 08/18/11 0540  NA 135 135  K 4.2 4.8  CL 100 102  CO2 25 25  BUN 14 16  CREATININE 1.24* 1.35*  GLUCOSE 109* 114*  CALCIUM 8.9 8.7   Lab Results  Component Value Date   INR 1.04 08/11/2011   INR 1.09 06/11/2011    Examination:  General appearance: alert, cooperative and mild distress Resp: clear to auscultation bilaterally Cardio: regular rate and rhythm GI: soft, non-tender; bowel sounds normal; no masses,  no organomegaly  Wound Exam:  clean, dry, intact   Drainage:  None: wound tissue dry  Motor Exam: EHL, FHL, Anterior Tibial and Posterior Tibial Intact  Sensory Exam: Superficial Peroneal, Deep Peroneal and Tibial normal  Vascular Exam: Right dorsalis pedis artery has 1+ (weak) pulse  Assessment:    2 Days Post-Op  Procedure(s) (LRB): TOTAL KNEE REVISION (Left)  ADDITIONAL DIAGNOSIS:  Active Problems:  * No active hospital problems. *   Acute Blood Loss Anemia   Plan: Physical Therapy as ordered Partial Weight Bearing @ 50% (PWB)  DVT Prophylaxis:  Xarelto, Foot Pumps and TED hose  DISCHARGE PLAN: Home  DISCHARGE NEEDS: HHPT, CPM, Walker and 3-in-1 comode seat  Holding down food this afternoon. Problems with pain control.  Will add oxycontin 20 mg q 12h.  Kery Batzel 08/19/2011, 5:44 PM

## 2011-08-20 LAB — BASIC METABOLIC PANEL
BUN: 13 mg/dL (ref 6–23)
CO2: 29 mEq/L (ref 19–32)
Calcium: 8.8 mg/dL (ref 8.4–10.5)
Chloride: 98 mEq/L (ref 96–112)
Creatinine, Ser: 1.29 mg/dL — ABNORMAL HIGH (ref 0.50–1.10)
GFR calc Af Amer: 49 mL/min — ABNORMAL LOW (ref >90)

## 2011-08-20 LAB — TISSUE CULTURE

## 2011-08-20 LAB — CBC
HCT: 22.7 % — ABNORMAL LOW (ref 36.0–46.0)
MCH: 31.3 pg (ref 26.0–34.0)
MCV: 91.2 fL (ref 78.0–100.0)
RDW: 15.9 % — ABNORMAL HIGH (ref 11.5–15.5)
WBC: 6.5 10*3/uL (ref 4.0–10.5)

## 2011-08-20 MED ORDER — OXYCODONE HCL 20 MG PO TB12: 20.0000 mg | ORAL_TABLET | Freq: Two times a day (BID) | ORAL | Status: DC

## 2011-08-20 MED ORDER — OXYCODONE HCL 5 MG PO TABS: 5.0000 mg | ORAL_TABLET | ORAL | Status: AC | PRN

## 2011-08-20 MED ORDER — METHOCARBAMOL 500 MG PO TABS: 500.0000 mg | ORAL_TABLET | Freq: Four times a day (QID) | ORAL | Status: AC | PRN

## 2011-08-20 MED ORDER — RIVAROXABAN 10 MG PO TABS: 10.0000 mg | ORAL_TABLET | ORAL | Status: DC

## 2011-08-20 NOTE — Progress Notes (Signed)
Patient ID: ESTHA FEW, female   DOB: 07/06/1945, 66 y.o.   MRN: [id number] PATIENT ID: TINSLEY EVERMAN        MRN:  [id number]          DOB/AGE: 05-18-1945 / 66 y.o.  [name], MD   Jacqualine Code, PA-C [address] [place], [place], [place]  [address]                             [phone]   PROGRESS NOTE  Subjective:  negative for Chest Pain  negative for Shortness of Breath  negative for Nausea/Vomiting   negative for Calf Pain  negative for Bowel Movement   Tolerating Diet: yes         Patient reports pain as mild.    Objective: Vital signs in last 24 hours:   Patient Vitals for the past 24 hrs:  BP Temp Temp src Pulse Resp SpO2  08/20/11 0519 110/70 mmHg 98.9 F (37.2 C) Oral 87  18  92 %  08/19/11 2117 132/68 mmHg - - 90  18  98 %  08/19/11 1342 135/72 mmHg 99.1 F (37.3 C) - 84  18  96 %      Intake/Output from previous day:   05/02 0701 - 05/03 0700 In: 360 [P.O.:360] Out: 650 [Urine:650]   Intake/Output this shift:       Intake/Output      05/02 0701 - 05/03 0700 05/03 0701 - 05/04 0700   P.O. 360    Total Intake(mL/kg) 360 (4.2)    Urine (mL/kg/hr) 650 (0.3)    Drains     Total Output 650    Net -290         Urine Occurrence 3 x       LABORATORY DATA:  Basename 08/20/11 0600 08/19/11 0625 08/18/11 1637 08/18/11 0540  WBC 6.5 7.3 9.8 6.7  HGB 7.8* 8.3* 9.2* 6.7*  HCT 22.7* 23.7* 26.7* 18.7*  PLT 147* 141* 172 175    Basename 08/20/11 0600 08/19/11 0625 08/18/11 0540  NA 134* 135 135  K 3.9 4.2 4.8  CL 98 100 102  CO2 29 25 25   BUN 13 14 16   CREATININE 1.29* 1.24* 1.35*  GLUCOSE 114* 109* 114*  CALCIUM 8.8 8.9 8.7   Lab Results  Component Value Date   INR 1.04 08/11/2011   INR 1.09 06/11/2011    Examination:  General appearance: alert, cooperative and no distress  Wound Exam: clean, dry, intact   Drainage:  None: wound tissue dry  Motor Exam: EHL, FHL, Anterior Tibial and Posterior Tibial Intact  Sensory Exam:  Superficial Peroneal, Deep Peroneal and Tibial normal  Vascular Exam: Normal  Assessment:    3 Days Post-Op  Procedure(s) (LRB): TOTAL KNEE REVISION (Left)  ADDITIONAL DIAGNOSIS:  Active Problems:  * No active hospital problems. *   Acute Blood Loss Anemia   Plan: Physical Therapy as ordered Partial Weight Bearing @ 50% (PWB)  DVT Prophylaxis:  Xarelto  DISCHARGE PLAN: Home  DISCHARGE NEEDS: HHPT   feeling better this am-ready for D/C, H&H low, but asymptomatic, without nausea this AM      Gualberto Wahlen W 08/20/2011, 8:05 AM

## 2011-08-20 NOTE — Discharge Instructions (Signed)
Home Health to be provided by [facility] [phone] 

## 2011-08-20 NOTE — Discharge Summary (Signed)
Crystal Campbell, MD   Crystal Code, PA-C [address] [place], [place], [place]  [address]                             [phone]  PATIENT ID: [name]        MRN:  [id number]          DOB/AGE: [birth year] / 66 y.o.    DISCHARGE SUMMARY  ADMISSION DATE:    08/17/2011 DISCHARGE DATE:   08/20/2011   ADMISSION DIAGNOSIS: retained, infected hardware left total knee  S/p removal of TKR and placement of antibiotic spacer  DISCHARGE DIAGNOSIS:  retained, infected hardware left total knee   S/p removal of TKR and placement of antibiotic spacer   ADDITIONAL DIAGNOSIS: Principal Problem:  *Prosthetic joint infection  Past Medical History  Diagnosis Date  . Hypertension   . Hyperlipemia   . PONV (postoperative nausea and vomiting)     scop patch before prior surgeries  . Blood transfusion   . 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 Arthritis     PROCEDURE: Procedure(s): LEFT TOTAL KNEE REVISION on 08/17/2011  CONSULTS:   NONE  HISTORY: [name] is a very pleasant 66 year old white female who is seen back today for follow up of her left knee surgery. Her previous history is that she had a left total knee arthroplasty performed by Dr. Priscille Kluver in 2005. She states she has had pain off and on in the left knee since her surgery, but this began to exacerbate in January of this year. She had gotten to the point where her pain was so severe and sharp that she was having difficulties tolerating the pain. It was of insidious onset recently such that caused her even to leave work. She went home and rested the knee and finally had improvement initially. When she is up and active the pain worsens. When she is at rest the pain is tolerable. She denies any recent history of injury or trauma to the knee. She has been using Advil intermittently throughout the day. She denies any fevers, shakes, or chills. Rest has been helpful, but activity again worsens her symptoms. We had sent her off  for a bone scan as well as laboratory studies and an aspiration was performed and revealed:  LABS:  1. Laboratory studies of the blood reveals a white count of 7,400. She does have some anemia with hemoglobin of 11.2. Sedimentation rate was 30. CRP was .74.  2. Aspiration revealed yellow, cloudy fluid with 660 white cells, 38 neutrophils, 54 lymphocytes, 7 monocytes, 1 eosinophil. There were no crystals noted. Glucose was 82. Protein was 4.1.  3. Cultures revealed no growth x 3 days. BONE SCAN: Three phase bone scan reveals asymmetric increased uptake around the left knee prosthesis that could be due to infection or loosening. This was around all three components.  She had failed conservative treatment and underwent the removal of prosthesis and antibiotic spacer implantation. She has had antibiotics for 6 weeks. Cleared by Dr Luciana Axe of ID for reimplantation. Admitted for antibx spacer removal and revision TKA.    HOSPITAL COURSE:  [name] is a 66 y.o. admitted on 08/17/2011 and found to have a diagnosis of retained, infected hardware left total knee. She had undergone a removal of the prosthesis and implantation on antibiotic spacer.  After appropriate laboratory studies were obtained  they were taken to the  operating room on 08/17/2011 and underwent Procedure(s): LEFT TOTAL KNEE REVISION.   They were given perioperative antibiotics:  Anti-infectives     Start     Dose/Rate Route Frequency Ordered Stop   08/18/11 0000   vancomycin (VANCOCIN) IVPB 1000 mg/200 mL premix        1,000 mg 200 mL/hr over 60 Minutes Intravenous Every 12 hours 08/17/11 1719 08/18/11 0118   08/17/11 1429   tobramycin (NEBCIN) powder  Status:  Discontinued          As needed 08/17/11 1430 08/17/11 1539        .  Tolerated the procedure well.  Placed with a foley intraoperatively.  Given Ofirmev at induction and for 48 hours.    POD #1, allowed out of bed to a chair.  PT for ambulation and exercise program.   Foley D/C'd in morning.  IV saline locked.  O2 discontionued.  POD #2, continued PT and ambulation. Hemovac pulled.  She had difficulty with pain control and Oxycontin 20mg  was started.  POD #3, she had a better night with good pain control.  Was eating this morning and denied nausea.  Denied any dizziness or profound weakness when getting OOB to the potty chair. Continued with PT. Had some difficulty with PT this morning.  Given one unit prbc's today  . The remainder of the hospital course was dedicated to ambulation and strengthening.   The patient was discharged on 3 Days Post-Op in  Stable condition.  Blood products given: 3 Units PRBC's   DIAGNOSTIC STUDIES: Recent vital signs: Patient Vitals for the past 24 hrs:  BP Temp Temp src Pulse Resp SpO2  08/20/11 0519 110/70 mmHg 98.9 F (37.2 C) Oral 87  18  92 %  Sep 02, 2011 2117 132/68 mmHg - - 90  18  98 %  Sep 02, 2011 1342 135/72 mmHg 99.1 F (37.3 C) - 84  18  96 %       Recent laboratory studies:  Basename 08/20/11 0600 09-02-11 0625 08/18/11 1637 08/18/11 0540  WBC 6.5 7.3 9.8 6.7  HGB 7.8* 8.3* 9.2* 6.7*  HCT 22.7* 23.7* 26.7* 18.7*  PLT 147* 141* 172 175    Basename 08/20/11 0600 09/02/11 0625 08/18/11 0540  NA 134* 135 135  K 3.9 4.2 4.8  CL 98 100 102  CO2 29 25 25   BUN 13 14 16   CREATININE 1.29* 1.24* 1.35*  GLUCOSE 114* 109* 114*  CALCIUM 8.8 8.9 8.7   Lab Results  Component Value Date   INR 1.04 08/11/2011   INR 1.09 06/11/2011     Recent Radiographic Studies :  No results found.  DISCHARGE INSTRUCTIONS: Discharge Orders    Future Orders Please Complete By Expires   Diet general      Call MD / Call 911      Comments:   If you experience chest pain or shortness of breath, CALL 911 and be transported to the hospital emergency room.  If you develope a fever above 101 F, pus (white drainage) or increased drainage or redness at the wound, or calf pain, call your surgeon's office.   Constipation Prevention       Comments:   Drink plenty of fluids.  Prune juice may be helpful.  You may use a stool softener, such as Colace (over the counter) 100 mg twice a day.  Use MiraLax (over the counter) for constipation as needed.   Increase activity slowly as tolerated      Weight Bearing  as taught in Physical Therapy      Comments:   Use a walker or crutches as instructed in Physical Therapy (PT).  50% weight bearing   Patient may shower      Comments:   You may shower without a dressing once there is no drainage.  Do not wash over the wound.  If drainage remains, cover wound with plastic wrap and then shower.   Driving restrictions      Comments:   No driving for 6 weeks   Lifting restrictions      Comments:   No lifting for 6 weeks   CPM      Comments:   Continuous passive motion machine (CPM):      Use the CPM from 0 to 50 for 8 hours per day.      You may increase by 5-10 per day.  You may break it up into 2 or 3 sessions per day.      Use CPM for 4 weeks or until you are told to stop.   TED hose      Comments:   Use stockings (TED hose) for left weeks on 3 leg(s).  You may remove them at night for sleeping.   Change dressing      Comments:   Change dressing on Saturday, then change the dressing daily with sterile 4 x 4 inch gauze dressing and apply TED hose.  You may clean the incision with alcohol prior to redressing.   Do not put a pillow under the knee. Place it under the heel.         DISCHARGE MEDICATIONS:   Medication List  As of 08/20/2011  8:13 AM   STOP taking these medications         aspirin 81 MG tablet      oxycodone 5 MG capsule         TAKE these medications         docusate sodium 100 MG capsule   Commonly known as: COLACE   Take 100 mg by mouth daily.      folic acid 800 MCG tablet   Commonly known as: FOLVITE   Take 800 mcg by mouth daily.      lisinopril 10 MG tablet   Commonly known as: PRINIVIL,ZESTRIL   Take 10 mg by mouth daily.      LIVALO 4 MG Tabs    Generic drug: Pitavastatin Calcium   Take 1 tablet by mouth daily.      methocarbamol 500 MG tablet   Commonly known as: ROBAXIN   Take 1 tablet (500 mg total) by mouth every 6 (six) hours as needed.      nebivolol 10 MG tablet   Commonly known as: BYSTOLIC   Take 10 mg by mouth daily.      niacin 500 MG tablet   Commonly known as: SLO-NIACIN   Take 250 mg by mouth daily.      oxyCODONE 5 MG immediate release tablet   Commonly known as: Oxy IR/ROXICODONE   Take 1-2 tablets (5-10 mg total) by mouth every 3 (three) hours as needed.      oxyCODONE 20 MG 12 hr tablet   Commonly known as: OXYCONTIN   Take 1 tablet (20 mg total) by mouth every 12 (twelve) hours.      rivaroxaban 10 MG Tabs tablet   Commonly known as: XARELTO   Take 1 tablet (10 mg total) by mouth daily.  FOLLOW UP VISIT:   Follow-up Information    Follow up with Valeria Batman, MD on 08/30/2011.   Contact information:   [address] [place] [address] [phone]          DISPOSITION:  Home    CONDITION:  Stable   [name] 08/20/2011, 8:13 AM

## 2011-08-20 NOTE — Progress Notes (Signed)
PT Progress Note:     08/20/11 1400  PT Visit Information  Last PT Received On 08/20/11  Assistance Needed +1  PT Time Calculation  PT Start Time 0820  PT Stop Time 0851  PT Time Calculation (min) 31 min  Subjective Data  Subjective "Im scared im going to get sick"  Precautions  Precautions Knee  Restrictions  Weight Bearing Restrictions Yes  LLE Weight Bearing PWB  LLE Partial Weight Bearing Percentage or Pounds 50  Cognition  Overall Cognitive Status Appears within functional limits for tasks assessed/performed  Arousal/Alertness Awake/alert  Orientation Level Oriented X4 / Intact  Behavior During Session [facility] for tasks performed  Bed Mobility  Bed Mobility Supine to Sit  Supine to Sit 4: Min assist;HOB flat  Details for Bed Mobility Assistance (A) for LLE  Transfers  Transfers Sit to Stand;Stand to Sit  Sit to Stand 4: Min guard;From bed;With upper extremity assist  Stand to Sit 4: Min guard;With upper extremity assist;To chair/3-in-1;With armrests  Details for Transfer Assistance Cues for hand placement & technique  Ambulation/Gait  Ambulation/Gait Assistance 5: Supervision  Ambulation Distance (Feet) 60 Feet  Assistive device Rolling walker  Ambulation/Gait Assistance Details Supervision for safety.  Encouragement to attempt step-through gait pattern. & cues to reinforce 50% PWBing LLE.    Gait Pattern Step-to pattern  Stairs No  Wheelchair Mobility  Wheelchair Mobility No  Exercises  Exercises Total Joint  Total Joint Exercises  Ankle Circles/Pumps AROM;20 reps;Both  Quad Sets AROM;Both;15 reps;Supine  Straight Leg Raises AAROM;Left;15 reps;Supine  Long Arc Quad AROM;Left;10 reps;Seated  Knee Flexion AAROM;Left;10 reps;Seated  PT - End of Session  Equipment Utilized During Treatment Gait belt  Activity Tolerance Patient tolerated treatment well;Patient limited by fatigue  Patient left in chair;with call bell/phone within reach;with family/visitor present  PT  - Assessment/Plan  Comments on Treatment Session Pt moves fairly well.  Pt reports generalized fatigue/weakness & "i'm scared of getting sick again" .  Pt very eager to d/c home.   PT Plan Discharge plan remains appropriate  PT Frequency 7X/week  Follow Up Recommendations Home health PT;Supervision - Intermittent  Equipment Recommended None recommended by PT  Acute Rehab PT Goals  PT Goal: Supine/Side to Sit - Progress Not met  PT Goal: Sit to Stand - Progress Not met  PT Goal: Ambulate - Progress Not met  PT Goal: Perform Home Exercise Program - Progress Not met      Verdell Face, [place] [phone] 08/20/2011

## 2011-08-21 LAB — TYPE AND SCREEN
ABO/RH(D): O NEG
DAT, IgG: NEGATIVE
Unit division: 0

## 2011-08-21 LAB — ANAEROBIC CULTURE: Gram Stain: NONE SEEN

## 2013-09-06 IMAGING — CR DG CHEST 2V
2 series · 2 of 2 positions shown · non-contrast
Comparison: 09/18/2003 at

CLINICAL DATA: Preoperative evaluation for left total knee
arthroplasty.  No current chest complaints.  Nonsmoker.  History of
hypertension and diabetes

CHEST - 2 VIEW

[view not recorded (1 of 2)]
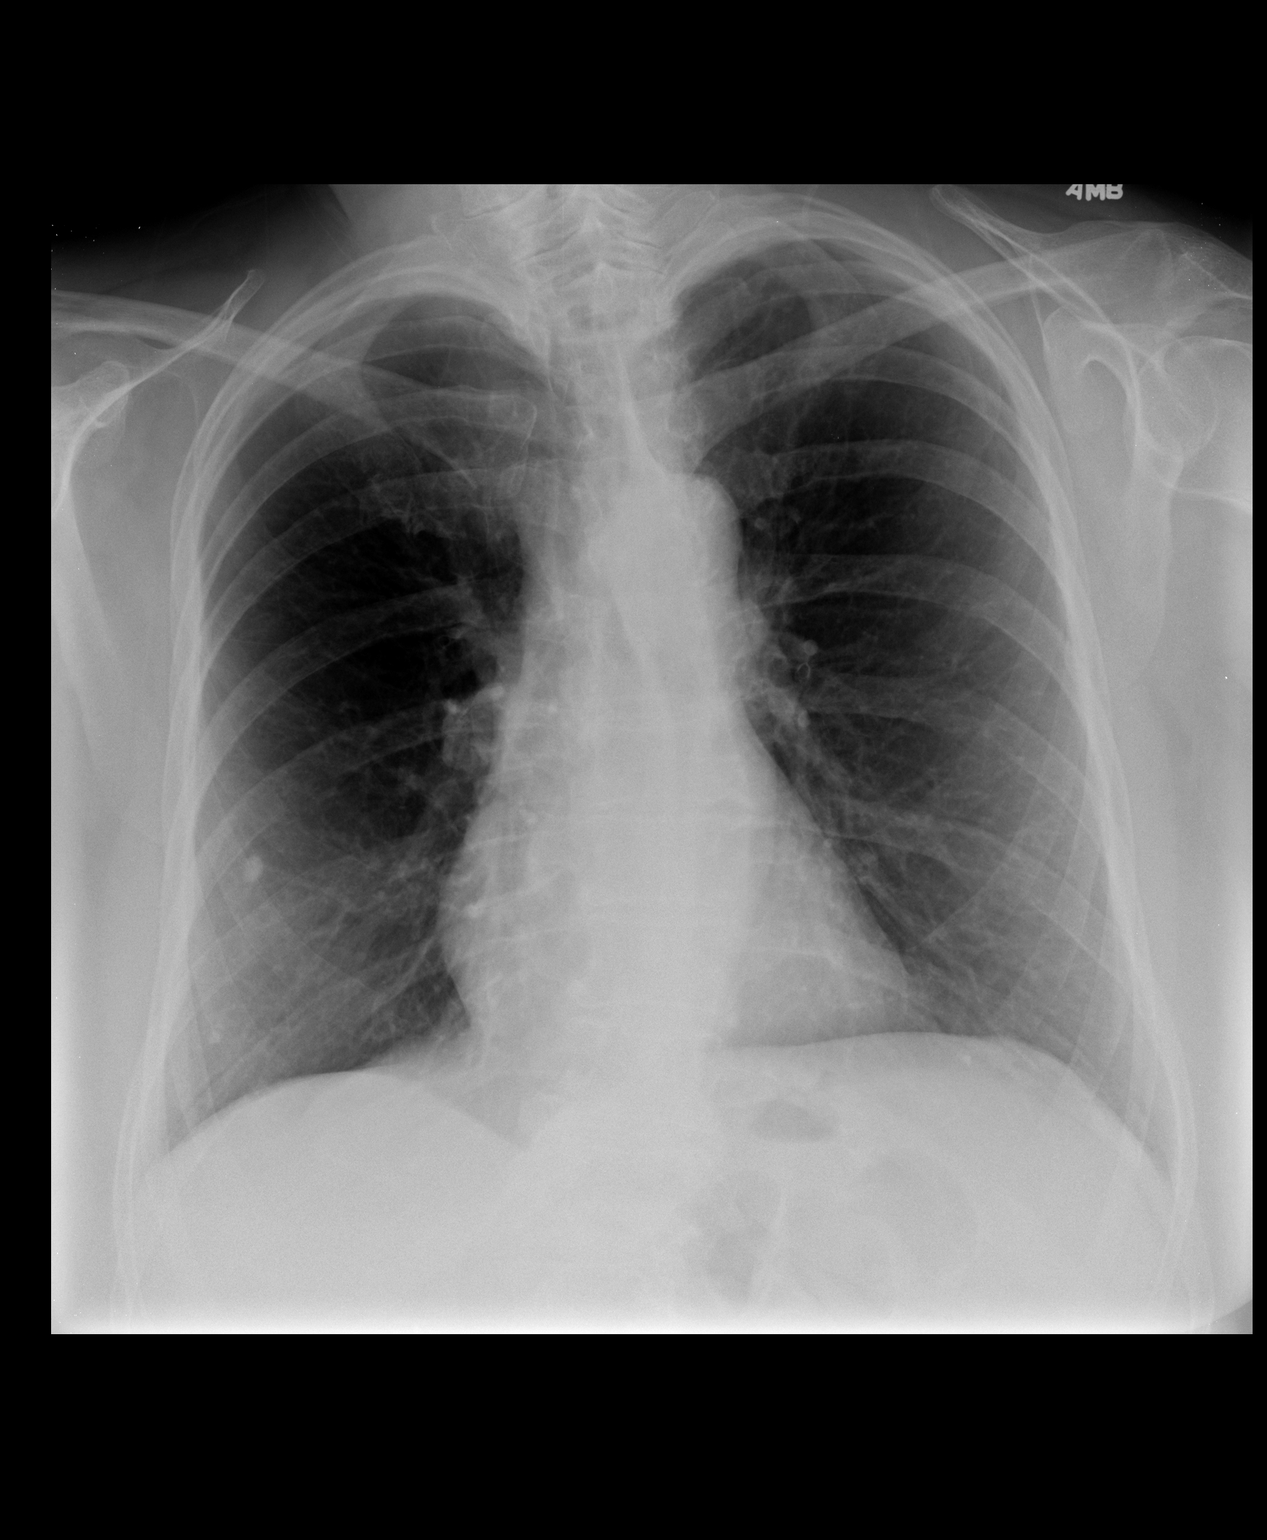

[view not recorded (2 of 2)]
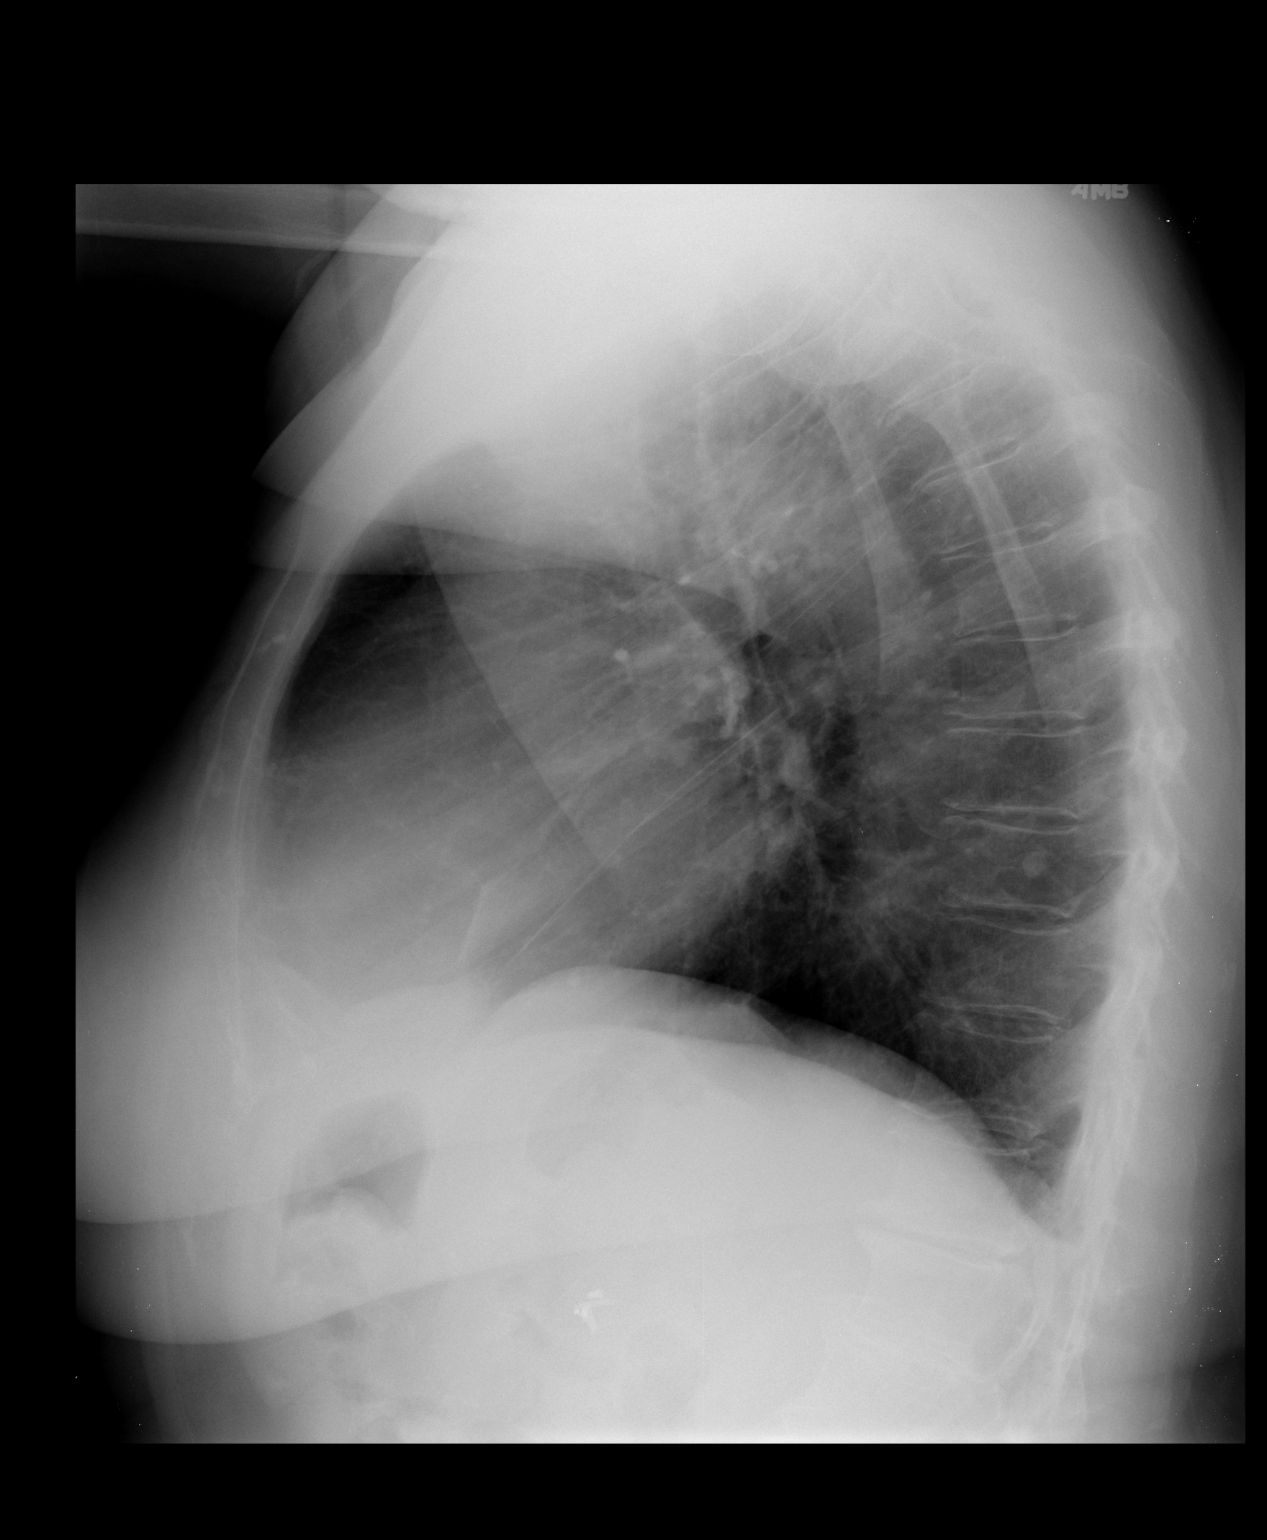

[2 of 2 positions shown; findings below may reference images not displayed]

FINDINGS: Heart and mediastinal contours are stable and within
normal limits.  The lung fields demonstrate several scattered
calcified granulomata and these are stable.  No other focal
infiltrates or signs of congestive failure are seen.  No pleural
fluid or significant peribronchial cuffing is noted.

Bony structures appear intact.

Surgical clips are seen in the right upper quadrant.
IMPRESSION: Findings compatible with prior granulomatous disease.  No new focal
worrisome or acute abnormalities seen

## 2020-12-23 ENCOUNTER — Other Ambulatory Visit: Payer: Self-pay | Admitting: Student

## 2020-12-23 DIAGNOSIS — M5416 Radiculopathy, lumbar region: Secondary | ICD-10-CM

## 2020-12-26 ENCOUNTER — Other Ambulatory Visit: Payer: Self-pay

## 2020-12-26 ENCOUNTER — Ambulatory Visit
Admission: RE | Admit: 2020-12-26 | Discharge: 2020-12-26 | Disposition: A | Discharge status: Home / Self Care | Payer: BC Managed Care – PPO | Source: Ambulatory Visit | Attending: Student | Admitting: Student

## 2020-12-26 DIAGNOSIS — M5416 Radiculopathy, lumbar region: Secondary | ICD-10-CM

## 2020-12-26 MED ORDER — IOPAMIDOL (ISOVUE-M 200) INJECTION 41%
1.0000 mL | Freq: Once | INTRAMUSCULAR | Status: AC
  Administered 2020-12-26: 1 mL via EPIDURAL

## 2020-12-26 MED ORDER — METHYLPREDNISOLONE ACETATE 40 MG/ML INJ SUSP (RADIOLOG
80.0000 mg | Freq: Once | INTRAMUSCULAR | Status: AC
  Administered 2020-12-26: 80 mg via EPIDURAL

## 2020-12-26 NOTE — Discharge Instructions (Signed)

## 2021-02-19 ENCOUNTER — Other Ambulatory Visit: Payer: Self-pay | Admitting: Student

## 2021-02-19 ENCOUNTER — Ambulatory Visit
Admission: RE | Admit: 2021-02-19 | Discharge: 2021-02-19 | Disposition: A | Discharge status: Home / Self Care | Payer: Medicare Other | Source: Ambulatory Visit | Attending: Student | Admitting: Student

## 2021-02-19 DIAGNOSIS — M5416 Radiculopathy, lumbar region: Secondary | ICD-10-CM

## 2021-02-20 ENCOUNTER — Other Ambulatory Visit: Payer: Self-pay | Admitting: Student

## 2021-02-20 DIAGNOSIS — M5416 Radiculopathy, lumbar region: Secondary | ICD-10-CM

## 2021-02-23 ENCOUNTER — Other Ambulatory Visit: Payer: Self-pay | Admitting: Neurological Surgery

## 2021-03-04 NOTE — Progress Notes (Signed)
Surgical Instructions    Your procedure is scheduled on 03/09/21.  Report to [facility] Main Entrance "A" at 8:20 A.M., then check in with the Admitting office.  Call this number if you have problems the morning of surgery:  [phone]   If you have any questions prior to your surgery date call [phone]: Open Monday-Friday 8am-4pm    Remember:  Do not eat after midnight the night before your surgery  You may drink clear liquids until 7:20  the morning of your surgery.   Clear liquids allowed are: Water, Non-Citrus Juices (without pulp), Carbonated Beverages, Clear Tea, Black Coffee ONLY (NO MILK, CREAM OR POWDERED CREAMER of any kind), and Gatorade    Take these medicines the morning of surgery with A SIP OF WATER: gabapentin (NEURONTIN)  nebivolol (BYSTOLIC)   As of today, STOP taking any Aspirin (unless otherwise instructed by your surgeon) celecoxib (CELEBREX), Aleve, Naproxen, Ibuprofen, Motrin, Advil, Goody's, BC's, all herbal medications, fish oil, and all vitamins.    After your COVID test   You are not required to quarantine however you are required to wear a well-fitting mask when you are out and around people not in your household.  If your mask becomes wet or soiled, replace with a new one.  Wash your hands often with soap and water for 20 seconds or clean your hands with an alcohol-based hand sanitizer that contains at least 60% alcohol.  Do not share personal items.  Notify your provider: if you are in close contact with someone who has COVID  or if you develop a fever of 100.4 or greater, sneezing, cough, sore throat, shortness of breath or body aches.           Do not wear jewelry or makeup Do not wear lotions, powders, perfumes or deodorant. Do not shave 48 hours prior to surgery.  Do not bring valuables to the hospital. DO Not wear nail polish, gel polish, artificial nails, or any other type of covering on natural nails including finger and  toenails. If patients have artificial nails, gel coating, etc. that need to be removed by a nail salon, please have this removed prior to surgery or surgery may need to be canceled/delayed if the surgeon/ anesthesia feels like the patient is unable to be adequately monitored.             [facility] is not responsible for any belongings or valuables.  Do NOT Smoke (Tobacco/Vaping)  24 hours prior to your procedure  If you use a CPAP at night, you may bring your mask for your overnight stay.   Contacts, glasses, hearing aids, dentures or partials may not be worn into surgery, please bring cases for these belongings   For patients admitted to the hospital, discharge time will be determined by your treatment team.   Patients discharged the day of surgery will not be allowed to drive home, and someone needs to stay with them for 24 hours.  NO VISITORS WILL BE ALLOWED IN PRE-OP WHERE PATIENTS ARE PREPPED FOR SURGERY.  ONLY 1 SUPPORT PERSON MAY BE PRESENT IN THE WAITING ROOM WHILE YOU ARE IN SURGERY.  IF YOU ARE TO BE ADMITTED, ONCE YOU ARE IN YOUR ROOM YOU WILL BE ALLOWED TWO (2) VISITORS. 1 (ONE) VISITOR MAY STAY OVERNIGHT BUT MUST ARRIVE TO THE ROOM BY 8pm.  Minor children may have two parents present. Special consideration for safety and communication needs will be reviewed on a case by case basis.  Special  instructions:    Oral Hygiene is also important to reduce your risk of infection.  Remember - BRUSH YOUR TEETH THE MORNING OF SURGERY WITH YOUR REGULAR TOOTHPASTE   [place]- Preparing For Surgery  Before surgery, you can play an important role. Because skin is not sterile, your skin needs to be as free of germs as possible. You can reduce the number of germs on your skin by washing with CHG (chlorahexidine gluconate) Soap before surgery.  CHG is an antiseptic cleaner which kills germs and bonds with the skin to continue killing germs even after washing.     Please do not use if you  have an allergy to CHG or antibacterial soaps. If your skin becomes reddened/irritated stop using the CHG.  Do not shave (including legs and underarms) for at least 48 hours prior to first CHG shower. It is OK to shave your face.  Please follow these instructions carefully.     Shower the NIGHT BEFORE SURGERY and the MORNING OF SURGERY with CHG Soap.   If you chose to wash your hair, wash your hair first as usual with your normal shampoo. After you shampoo, rinse your hair and body thoroughly to remove the shampoo.  Then [employer] and genitals (private parts) with your normal soap and rinse thoroughly to remove soap.  After that Use CHG Soap as you would any other liquid soap. You can apply CHG directly to the skin and wash gently with a scrungie or a clean washcloth.   Apply the CHG Soap to your body ONLY FROM THE NECK DOWN.  Do not use on open wounds or open sores. Avoid contact with your eyes, ears, mouth and genitals (private parts). Wash Face and genitals (private parts)  with your normal soap.   Wash thoroughly, paying special attention to the area where your surgery will be performed.  Thoroughly rinse your body with warm water from the neck down.  DO NOT shower/wash with your normal soap after using and rinsing off the CHG Soap.  Pat yourself dry with a CLEAN TOWEL.  Wear CLEAN PAJAMAS to bed the night before surgery  Place CLEAN SHEETS on your bed the night before your surgery  DO NOT SLEEP WITH PETS.   Day of Surgery:  Take a shower with CHG soap. Wear Clean/Comfortable clothing the morning of surgery Do not apply any deodorants/lotions.   Remember to brush your teeth WITH YOUR REGULAR TOOTHPASTE.   Please read over the following fact sheets that you were given.

## 2021-03-05 ENCOUNTER — Encounter (HOSPITAL_COMMUNITY)
Admission: RE | Admit: 2021-03-05 | Discharge: 2021-03-05 | Disposition: A | Discharge status: Home / Self Care | Payer: Medicare Other | Source: Ambulatory Visit | Attending: Neurological Surgery | Admitting: Neurological Surgery

## 2021-03-05 ENCOUNTER — Encounter (HOSPITAL_COMMUNITY): Payer: Self-pay

## 2021-03-05 ENCOUNTER — Other Ambulatory Visit: Payer: Self-pay

## 2021-03-05 VITALS — BP 155/69 | HR 58 | Temp 98.2°F | Resp 19 | Ht 66.0 in | Wt 202.0 lb

## 2021-03-05 DIAGNOSIS — Z20822 Contact with and (suspected) exposure to covid-19: Secondary | ICD-10-CM | POA: Insufficient documentation

## 2021-03-05 DIAGNOSIS — K759 Inflammatory liver disease, unspecified: Secondary | ICD-10-CM | POA: Diagnosis not present

## 2021-03-05 DIAGNOSIS — I2510 Atherosclerotic heart disease of native coronary artery without angina pectoris: Secondary | ICD-10-CM

## 2021-03-05 DIAGNOSIS — Z01818 Encounter for other preprocedural examination: Secondary | ICD-10-CM | POA: Diagnosis not present

## 2021-03-05 LAB — COMPREHENSIVE METABOLIC PANEL
ALT: 20 U/L (ref 0–44)
AST: 26 U/L (ref 15–41)
Albumin: 3.9 g/dL (ref 3.5–5.0)
Alkaline Phosphatase: 118 U/L (ref 38–126)
Anion gap: 8 (ref 5–15)
BUN: 23 mg/dL (ref 8–23)
CO2: 25 mmol/L (ref 22–32)
Calcium: 9 mg/dL (ref 8.9–10.3)
Chloride: 101 mmol/L (ref 98–111)
Creatinine, Ser: 1.33 mg/dL — ABNORMAL HIGH (ref 0.44–1.00)
GFR, Estimated: 41 mL/min — ABNORMAL LOW (ref >60)
Glucose, Bld: 95 mg/dL (ref 70–99)
Potassium: 4.2 mmol/L (ref 3.5–5.1)
Sodium: 134 mmol/L — ABNORMAL LOW (ref 135–145)
Total Bilirubin: 1 mg/dL (ref 0.3–1.2)
Total Protein: 7 g/dL (ref 6.5–8.1)

## 2021-03-05 LAB — CBC
HCT: 36.9 % (ref 36.0–46.0)
Hemoglobin: 12.2 g/dL (ref 12.0–15.0)
MCH: 33.2 pg (ref 26.0–34.0)
MCHC: 33.1 g/dL (ref 30.0–36.0)
MCV: 100.5 fL — ABNORMAL HIGH (ref 80.0–100.0)
Platelets: 172 10*3/uL (ref 150–400)
RBC: 3.67 MIL/uL — ABNORMAL LOW (ref 3.87–5.11)
RDW: 13.2 % (ref 11.5–15.5)
WBC: 5.7 10*3/uL (ref 4.0–10.5)
nRBC: 0 % (ref 0.0–0.2)

## 2021-03-05 LAB — SURGICAL PCR SCREEN
MRSA, PCR: NEGATIVE
Staphylococcus aureus: NEGATIVE

## 2021-03-05 LAB — PROTIME-INR
INR: 1.1 (ref 0.8–1.2)
Prothrombin Time: 14.1 seconds (ref 11.4–15.2)

## 2021-03-05 LAB — SARS CORONAVIRUS 2 (TAT 6-24 HRS): SARS Coronavirus 2: NEGATIVE

## 2021-03-05 NOTE — Progress Notes (Signed)
Surgical Instructions    Your procedure is scheduled on 03/09/21.  Report to [facility] Main Entrance "A" at 8:20 A.M., then check in with the Admitting office.  Call this number if you have problems the morning of surgery:  [phone]   If you have any questions prior to your surgery date call [phone]: Open Monday-Friday 8am-4pm    Remember:  Do not eat or drink after midnight the night before your surgery   Take these medicines the morning of surgery with A SIP OF WATER: gabapentin (NEURONTIN)  nebivolol (BYSTOLIC)   As of today, STOP taking any Aspirin (unless otherwise instructed by your surgeon) celecoxib (CELEBREX), Aleve, Naproxen, Ibuprofen, Motrin, Advil, Goody's, BC's, all herbal medications, fish oil, and all vitamins.    After your COVID test   You are not required to quarantine however you are required to wear a well-fitting mask when you are out and around people not in your household.  If your mask becomes wet or soiled, replace with a new one.  Wash your hands often with soap and water for 20 seconds or clean your hands with an alcohol-based hand sanitizer that contains at least 60% alcohol.  Do not share personal items.  Notify your provider: if you are in close contact with someone who has COVID  or if you develop a fever of 100.4 or greater, sneezing, cough, sore throat, shortness of breath or body aches.           Do not wear jewelry or makeup Do not wear lotions, powders, perfumes or deodorant. Do not shave 48 hours prior to surgery.  Do not bring valuables to the hospital. DO Not wear nail polish, gel polish, artificial nails, or any other type of covering on natural nails including finger and toenails. If patients have artificial nails, gel coating, etc. that need to be removed by a nail salon, please have this removed prior to surgery or surgery may need to be canceled/delayed if the surgeon/ anesthesia feels like the patient is unable to be  adequately monitored.             [place] [facility] is not responsible for any belongings or valuables.  Do NOT Smoke (Tobacco/Vaping)  24 hours prior to your procedure  If you use a CPAP at night, you may bring your mask for your overnight stay.   Contacts, glasses, hearing aids, dentures or partials may not be worn into surgery, please bring cases for these belongings   For patients admitted to the hospital, discharge time will be determined by your treatment team.   Patients discharged the day of surgery will not be allowed to drive home, and someone needs to stay with them for 24 hours.  NO VISITORS WILL BE ALLOWED IN PRE-OP WHERE PATIENTS ARE PREPPED FOR SURGERY.  ONLY 1 SUPPORT PERSON MAY BE PRESENT IN THE WAITING ROOM WHILE YOU ARE IN SURGERY.  IF YOU ARE TO BE ADMITTED, ONCE YOU ARE IN YOUR ROOM YOU WILL BE ALLOWED TWO (2) VISITORS. 1 (ONE) VISITOR MAY STAY OVERNIGHT BUT MUST ARRIVE TO THE ROOM BY 8pm.  Minor children may have two parents present. Special consideration for safety and communication needs will be reviewed on a case by case basis.  Special instructions:    Oral Hygiene is also important to reduce your risk of infection.  Remember - BRUSH YOUR TEETH THE MORNING OF SURGERY WITH YOUR REGULAR TOOTHPASTE   [place]- Preparing For Surgery  Before surgery, you can play  an important role. Because skin is not sterile, your skin needs to be as free of germs as possible. You can reduce the number of germs on your skin by washing with CHG (chlorahexidine gluconate) Soap before surgery.  CHG is an antiseptic cleaner which kills germs and bonds with the skin to continue killing germs even after washing.     Please do not use if you have an allergy to CHG or antibacterial soaps. If your skin becomes reddened/irritated stop using the CHG.  Do not shave (including legs and underarms) for at least 48 hours prior to first CHG shower. It is OK to shave your face.  Please follow these  instructions carefully.     Shower the NIGHT BEFORE SURGERY and the MORNING OF SURGERY with CHG Soap.   If you chose to wash your hair, wash your hair first as usual with your normal shampoo. After you shampoo, rinse your hair and body thoroughly to remove the shampoo.  Then [employer] and genitals (private parts) with your normal soap and rinse thoroughly to remove soap.  After that Use CHG Soap as you would any other liquid soap. You can apply CHG directly to the skin and wash gently with a scrungie or a clean washcloth.   Apply the CHG Soap to your body ONLY FROM THE NECK DOWN.  Do not use on open wounds or open sores. Avoid contact with your eyes, ears, mouth and genitals (private parts). Wash Face and genitals (private parts)  with your normal soap.   Wash thoroughly, paying special attention to the area where your surgery will be performed.  Thoroughly rinse your body with warm water from the neck down.  DO NOT shower/wash with your normal soap after using and rinsing off the CHG Soap.  Pat yourself dry with a CLEAN TOWEL.  Wear CLEAN PAJAMAS to bed the night before surgery  Place CLEAN SHEETS on your bed the night before your surgery  DO NOT SLEEP WITH PETS.   Day of Surgery:  Take a shower with CHG soap. Wear Clean/Comfortable clothing the morning of surgery Do not apply any deodorants/lotions.   Remember to brush your teeth WITH YOUR REGULAR TOOTHPASTE.   Please read over the following fact sheets that you were given.

## 2021-03-05 NOTE — Progress Notes (Signed)
PCP - Dr. Kem Boroughs Cardiologist - Denies  PPM/ICD - Denies  Chest x-ray - N/A EKG - 03/05/21 Stress Test - Denies ECHO - Denies Cardiac Cath - Denies  Sleep Study - Denies  Diabetic : Denies  Blood Thinner Instructions: N/A Aspirin Instructions: N/A  ERAS Protcol - No  COVID TEST- 03/05/21 in PAT   Anesthesia review: Yes, abnormal EKG  Patient denies shortness of breath, fever, cough and chest pain at PAT appointment   All instructions explained to the patient, with a verbal understanding of the material. Patient agrees to go over the instructions while at home for a better understanding. Patient also instructed to self quarantine after being tested for COVID-19. The opportunity to ask questions was provided.

## 2021-03-09 ENCOUNTER — Ambulatory Visit (HOSPITAL_COMMUNITY): Payer: Medicare Other | Admitting: Certified Registered Nurse Anesthetist

## 2021-03-09 ENCOUNTER — Encounter (HOSPITAL_COMMUNITY)
Admission: RE | Disposition: A | Discharge status: Home / Self Care | Payer: Self-pay | Source: Home / Self Care | Attending: Neurological Surgery

## 2021-03-09 ENCOUNTER — Ambulatory Visit (HOSPITAL_COMMUNITY): Payer: Medicare Other | Admitting: Physician Assistant

## 2021-03-09 ENCOUNTER — Other Ambulatory Visit (HOSPITAL_COMMUNITY): Payer: Medicare Other

## 2021-03-09 ENCOUNTER — Encounter (HOSPITAL_COMMUNITY): Payer: Self-pay | Admitting: Neurological Surgery

## 2021-03-09 ENCOUNTER — Ambulatory Visit (HOSPITAL_COMMUNITY)
Admission: RE | Admit: 2021-03-09 | Discharge: 2021-03-10 | Disposition: A | Discharge status: Home / Self Care | Payer: Medicare Other | Attending: Neurological Surgery | Admitting: Neurological Surgery

## 2021-03-09 ENCOUNTER — Ambulatory Visit (HOSPITAL_COMMUNITY): Payer: Medicare Other

## 2021-03-09 ENCOUNTER — Other Ambulatory Visit: Payer: Self-pay

## 2021-03-09 DIAGNOSIS — Z419 Encounter for procedure for purposes other than remedying health state, unspecified: Secondary | ICD-10-CM

## 2021-03-09 DIAGNOSIS — M5416 Radiculopathy, lumbar region: Secondary | ICD-10-CM | POA: Insufficient documentation

## 2021-03-09 DIAGNOSIS — M4807 Spinal stenosis, lumbosacral region: Secondary | ICD-10-CM | POA: Insufficient documentation

## 2021-03-09 DIAGNOSIS — Z9889 Other specified postprocedural states: Secondary | ICD-10-CM

## 2021-03-09 DIAGNOSIS — K759 Inflammatory liver disease, unspecified: Secondary | ICD-10-CM | POA: Diagnosis not present

## 2021-03-09 DIAGNOSIS — E785 Hyperlipidemia, unspecified: Secondary | ICD-10-CM | POA: Diagnosis not present

## 2021-03-09 DIAGNOSIS — M1990 Unspecified osteoarthritis, unspecified site: Secondary | ICD-10-CM | POA: Insufficient documentation

## 2021-03-09 DIAGNOSIS — I4891 Unspecified atrial fibrillation: Secondary | ICD-10-CM | POA: Insufficient documentation

## 2021-03-09 DIAGNOSIS — I10 Essential (primary) hypertension: Secondary | ICD-10-CM | POA: Diagnosis not present

## 2021-03-09 HISTORY — PX: LUMBAR LAMINECTOMY/DECOMPRESSION MICRODISCECTOMY: SHX5026

## 2021-03-09 LAB — BASIC METABOLIC PANEL
Anion gap: 9 (ref 5–15)
BUN: 20 mg/dL (ref 8–23)
CO2: 24 mmol/L (ref 22–32)
Calcium: 8.7 mg/dL — ABNORMAL LOW (ref 8.9–10.3)
Chloride: 99 mmol/L (ref 98–111)
Creatinine, Ser: 1.3 mg/dL — ABNORMAL HIGH (ref 0.44–1.00)
GFR, Estimated: 43 mL/min — ABNORMAL LOW (ref >60)
Glucose, Bld: 151 mg/dL — ABNORMAL HIGH (ref 70–99)
Potassium: 4.4 mmol/L (ref 3.5–5.1)
Sodium: 132 mmol/L — ABNORMAL LOW (ref 135–145)

## 2021-03-09 SURGERY — LUMBAR LAMINECTOMY/DECOMPRESSION MICRODISCECTOMY 1 LEVEL
Anesthesia: General | Site: Back | Laterality: Left

## 2021-03-09 MED ORDER — ONDANSETRON HCL 4 MG/2ML IJ SOLN
INTRAMUSCULAR | Status: DC | PRN
  Administered 2021-03-09: 4 mg via INTRAVENOUS

## 2021-03-09 MED ORDER — DEXAMETHASONE SODIUM PHOSPHATE 10 MG/ML IJ SOLN
4.0000 mg | Freq: Four times a day (QID) | INTRAMUSCULAR | Status: DC
  Administered 2021-03-10: 4 mg via INTRAVENOUS
  Filled 2021-03-09: qty 1

## 2021-03-09 MED ORDER — DILTIAZEM HCL-DEXTROSE 125-5 MG/125ML-% IV SOLN (PREMIX)
5.0000 mg/h | INTRAVENOUS | Status: DC
  Filled 2021-03-09: qty 125

## 2021-03-09 MED ORDER — ROCURONIUM BROMIDE 10 MG/ML (PF) SYRINGE
PREFILLED_SYRINGE | INTRAVENOUS | Status: AC
  Filled 2021-03-09: qty 10

## 2021-03-09 MED ORDER — BUPIVACAINE HCL (PF) 0.25 % IJ SOLN
INTRAMUSCULAR | Status: DC | PRN
  Administered 2021-03-09: 5 mL

## 2021-03-09 MED ORDER — CHLORHEXIDINE GLUCONATE CLOTH 2 % EX PADS: 6.0000 | MEDICATED_PAD | Freq: Once | CUTANEOUS | Status: DC

## 2021-03-09 MED ORDER — GABAPENTIN 300 MG PO CAPS
300.0000 mg | ORAL_CAPSULE | ORAL | Status: DC
  Filled 2021-03-09: qty 1

## 2021-03-09 MED ORDER — SODIUM CHLORIDE 0.9 % IV SOLN
250.0000 mL | INTRAVENOUS | Status: DC
  Administered 2021-03-09: 250 mL via INTRAVENOUS

## 2021-03-09 MED ORDER — FENTANYL CITRATE (PF) 250 MCG/5ML IJ SOLN
INTRAMUSCULAR | Status: DC | PRN
  Administered 2021-03-09: 100 ug via INTRAVENOUS
  Administered 2021-03-09: 25 ug via INTRAVENOUS

## 2021-03-09 MED ORDER — SUGAMMADEX SODIUM 200 MG/2ML IV SOLN
INTRAVENOUS | Status: DC | PRN
  Administered 2021-03-09: 360 mg via INTRAVENOUS

## 2021-03-09 MED ORDER — THROMBIN 5000 UNITS EX SOLR
OROMUCOSAL | Status: DC | PRN
  Administered 2021-03-09: 5 mL via TOPICAL

## 2021-03-09 MED ORDER — NEBIVOLOL HCL 5 MG PO TABS
5.0000 mg | ORAL_TABLET | Freq: Every day | ORAL | Status: DC
  Administered 2021-03-10: 5 mg via ORAL
  Filled 2021-03-09: qty 1

## 2021-03-09 MED ORDER — PROPOFOL 10 MG/ML IV BOLUS
INTRAVENOUS | Status: DC | PRN
  Administered 2021-03-09: 100 mg via INTRAVENOUS

## 2021-03-09 MED ORDER — SODIUM CHLORIDE 0.9% FLUSH
3.0000 mL | Freq: Two times a day (BID) | INTRAVENOUS | Status: DC
  Administered 2021-03-10: 3 mL via INTRAVENOUS

## 2021-03-09 MED ORDER — SODIUM CHLORIDE 0.9% FLUSH: 3.0000 mL | INTRAVENOUS | Status: DC | PRN

## 2021-03-09 MED ORDER — THROMBIN 5000 UNITS EX SOLR
CUTANEOUS | Status: AC
  Filled 2021-03-09: qty 5000

## 2021-03-09 MED ORDER — DEXAMETHASONE 4 MG PO TABS
4.0000 mg | ORAL_TABLET | Freq: Four times a day (QID) | ORAL | Status: DC
  Administered 2021-03-09 – 2021-03-10 (×2): 4 mg via ORAL
  Filled 2021-03-09 (×6): qty 1

## 2021-03-09 MED ORDER — METHOCARBAMOL 500 MG PO TABS: 500.0000 mg | ORAL_TABLET | Freq: Four times a day (QID) | ORAL | Status: DC | PRN

## 2021-03-09 MED ORDER — HYDROCODONE-ACETAMINOPHEN 7.5-325 MG PO TABS
1.0000 | ORAL_TABLET | Freq: Four times a day (QID) | ORAL | Status: DC
  Administered 2021-03-09 – 2021-03-10 (×2): 1 via ORAL
  Filled 2021-03-09 (×2): qty 1

## 2021-03-09 MED ORDER — ACETAMINOPHEN 500 MG PO TABS
1000.0000 mg | ORAL_TABLET | ORAL | Status: AC
  Administered 2021-03-09: 1000 mg via ORAL
  Filled 2021-03-09: qty 2

## 2021-03-09 MED ORDER — FENTANYL CITRATE (PF) 250 MCG/5ML IJ SOLN
INTRAMUSCULAR | Status: AC
  Filled 2021-03-09: qty 5

## 2021-03-09 MED ORDER — BUPIVACAINE HCL (PF) 0.25 % IJ SOLN
INTRAMUSCULAR | Status: AC
  Filled 2021-03-09: qty 30

## 2021-03-09 MED ORDER — ONDANSETRON HCL 4 MG/2ML IJ SOLN: 4.0000 mg | Freq: Four times a day (QID) | INTRAMUSCULAR | Status: DC | PRN

## 2021-03-09 MED ORDER — SCOPOLAMINE 1 MG/3DAYS TD PT72
MEDICATED_PATCH | TRANSDERMAL | Status: DC | PRN
  Administered 2021-03-09: 1 via TRANSDERMAL

## 2021-03-09 MED ORDER — ACETAMINOPHEN 325 MG PO TABS: 650.0000 mg | ORAL_TABLET | ORAL | Status: DC | PRN

## 2021-03-09 MED ORDER — ACETAMINOPHEN 650 MG RE SUPP: 650.0000 mg | RECTAL | Status: DC | PRN

## 2021-03-09 MED ORDER — ONDANSETRON HCL 4 MG/2ML IJ SOLN
INTRAMUSCULAR | Status: AC
  Filled 2021-03-09: qty 2

## 2021-03-09 MED ORDER — PHENOL 1.4 % MT LIQD: 1.0000 | OROMUCOSAL | Status: DC | PRN

## 2021-03-09 MED ORDER — LACTATED RINGERS IV SOLN: INTRAVENOUS | Status: DC

## 2021-03-09 MED ORDER — FENTANYL CITRATE (PF) 100 MCG/2ML IJ SOLN: 25.0000 ug | INTRAMUSCULAR | Status: DC | PRN

## 2021-03-09 MED ORDER — LIDOCAINE 2% (20 MG/ML) 5 ML SYRINGE
INTRAMUSCULAR | Status: AC
  Filled 2021-03-09: qty 5

## 2021-03-09 MED ORDER — MENTHOL 3 MG MT LOZG: 1.0000 | LOZENGE | OROMUCOSAL | Status: DC | PRN

## 2021-03-09 MED ORDER — LIDOCAINE 2% (20 MG/ML) 5 ML SYRINGE
INTRAMUSCULAR | Status: DC | PRN
  Administered 2021-03-09: 80 mg via INTRAVENOUS

## 2021-03-09 MED ORDER — DEXAMETHASONE SODIUM PHOSPHATE 10 MG/ML IJ SOLN
10.0000 mg | Freq: Once | INTRAMUSCULAR | Status: AC
  Administered 2021-03-09: 10 mg via INTRAVENOUS
  Filled 2021-03-09: qty 1

## 2021-03-09 MED ORDER — PROPOFOL 10 MG/ML IV BOLUS
INTRAVENOUS | Status: AC
  Filled 2021-03-09: qty 20

## 2021-03-09 MED ORDER — ONDANSETRON HCL 4 MG PO TABS: 4.0000 mg | ORAL_TABLET | Freq: Four times a day (QID) | ORAL | Status: DC | PRN

## 2021-03-09 MED ORDER — ORAL CARE MOUTH RINSE: 15.0000 mL | Freq: Once | OROMUCOSAL | Status: AC

## 2021-03-09 MED ORDER — CEFAZOLIN SODIUM-DEXTROSE 2-4 GM/100ML-% IV SOLN
2.0000 g | Freq: Three times a day (TID) | INTRAVENOUS | Status: AC
  Administered 2021-03-09 – 2021-03-10 (×2): 2 g via INTRAVENOUS
  Filled 2021-03-09 (×2): qty 100

## 2021-03-09 MED ORDER — CHLORHEXIDINE GLUCONATE 0.12 % MT SOLN
15.0000 mL | Freq: Once | OROMUCOSAL | Status: AC
  Administered 2021-03-09: 15 mL via OROMUCOSAL
  Filled 2021-03-09: qty 15

## 2021-03-09 MED ORDER — MORPHINE SULFATE (PF) 2 MG/ML IV SOLN: 2.0000 mg | INTRAVENOUS | Status: DC | PRN

## 2021-03-09 MED ORDER — METHOCARBAMOL 1000 MG/10ML IJ SOLN
500.0000 mg | Freq: Four times a day (QID) | INTRAVENOUS | Status: DC | PRN
  Filled 2021-03-09: qty 5

## 2021-03-09 MED ORDER — DILTIAZEM LOAD VIA INFUSION
10.0000 mg | Freq: Once | INTRAVENOUS | Status: DC
  Filled 2021-03-09: qty 10

## 2021-03-09 MED ORDER — GABAPENTIN 600 MG PO TABS
600.0000 mg | ORAL_TABLET | Freq: Three times a day (TID) | ORAL | Status: DC
  Administered 2021-03-09 – 2021-03-10 (×3): 600 mg via ORAL
  Filled 2021-03-09 (×3): qty 1

## 2021-03-09 MED ORDER — CEFAZOLIN SODIUM-DEXTROSE 2-4 GM/100ML-% IV SOLN
2.0000 g | INTRAVENOUS | Status: AC
  Administered 2021-03-09: 2 g via INTRAVENOUS
  Filled 2021-03-09: qty 100

## 2021-03-09 MED ORDER — 0.9 % SODIUM CHLORIDE (POUR BTL) OPTIME
TOPICAL | Status: DC | PRN
  Administered 2021-03-09: 1000 mL

## 2021-03-09 MED ORDER — SENNA 8.6 MG PO TABS
1.0000 | ORAL_TABLET | Freq: Two times a day (BID) | ORAL | Status: DC
  Administered 2021-03-09 – 2021-03-10 (×2): 8.6 mg via ORAL
  Filled 2021-03-09 (×2): qty 1

## 2021-03-09 MED ORDER — PHENYLEPHRINE HCL-NACL 20-0.9 MG/250ML-% IV SOLN
INTRAVENOUS | Status: DC | PRN
  Administered 2021-03-09: 15 ug/min via INTRAVENOUS

## 2021-03-09 MED ORDER — DOCUSATE SODIUM 100 MG PO CAPS: 100.0000 mg | ORAL_CAPSULE | Freq: Every day | ORAL | Status: DC | PRN

## 2021-03-09 MED ORDER — SCOPOLAMINE 1 MG/3DAYS TD PT72
MEDICATED_PATCH | TRANSDERMAL | Status: AC
  Filled 2021-03-09: qty 1

## 2021-03-09 MED ORDER — CELECOXIB 200 MG PO CAPS
200.0000 mg | ORAL_CAPSULE | Freq: Two times a day (BID) | ORAL | Status: DC
  Administered 2021-03-09 – 2021-03-10 (×2): 200 mg via ORAL
  Filled 2021-03-09 (×2): qty 1

## 2021-03-09 MED ORDER — ROCURONIUM BROMIDE 10 MG/ML (PF) SYRINGE
PREFILLED_SYRINGE | INTRAVENOUS | Status: DC | PRN
  Administered 2021-03-09: 10 mg via INTRAVENOUS
  Administered 2021-03-09: 70 mg via INTRAVENOUS

## 2021-03-09 MED ORDER — POTASSIUM CHLORIDE IN NACL 20-0.9 MEQ/L-% IV SOLN
INTRAVENOUS | Status: DC
  Filled 2021-03-09: qty 1000

## 2021-03-09 MED ORDER — OXYBUTYNIN CHLORIDE ER 10 MG PO TB24
10.0000 mg | ORAL_TABLET | Freq: Every day | ORAL | Status: DC
  Administered 2021-03-09: 10 mg via ORAL
  Filled 2021-03-09 (×2): qty 1

## 2021-03-09 MED ORDER — DEXAMETHASONE SODIUM PHOSPHATE 10 MG/ML IJ SOLN
INTRAMUSCULAR | Status: AC
  Filled 2021-03-09: qty 1

## 2021-03-09 SURGICAL SUPPLY — 41 items
BAG COUNTER SPONGE SURGICOUNT (BAG) ×4 IMPLANT
BAND RUBBER #18 3X1/16 STRL (MISCELLANEOUS) ×4 IMPLANT
BENZOIN TINCTURE PRP APPL 2/3 (GAUZE/BANDAGES/DRESSINGS) ×2 IMPLANT
BUR CARBIDE MATCH 3.0 (BURR) ×2 IMPLANT
CANISTER SUCT 3000ML PPV (MISCELLANEOUS) ×2 IMPLANT
DRAPE LAPAROTOMY 100X72X124 (DRAPES) ×2 IMPLANT
DRAPE MICROSCOPE LEICA (MISCELLANEOUS) ×2 IMPLANT
DRAPE SURG 17X23 STRL (DRAPES) ×2 IMPLANT
DRSG OPSITE POSTOP 3X4 (GAUZE/BANDAGES/DRESSINGS) ×2 IMPLANT
DURAPREP 26ML APPLICATOR (WOUND CARE) ×2 IMPLANT
ELECT REM PT RETURN 9FT ADLT (ELECTROSURGICAL) ×2
ELECTRODE REM PT RTRN 9FT ADLT (ELECTROSURGICAL) ×1 IMPLANT
GAUZE 4X4 16PLY ~~LOC~~+RFID DBL (SPONGE) ×2 IMPLANT
GLOVE SRG 8 PF TXTR STRL LF DI (GLOVE) ×1 IMPLANT
GLOVE SURG ENC MOIS LTX SZ7 (GLOVE) ×2 IMPLANT
GLOVE SURG ENC MOIS LTX SZ8 (GLOVE) ×2 IMPLANT
GLOVE SURG UNDER POLY LF SZ6.5 (GLOVE) ×2 IMPLANT
GLOVE SURG UNDER POLY LF SZ7 (GLOVE) ×2 IMPLANT
GLOVE SURG UNDER POLY LF SZ7.5 (GLOVE) ×4 IMPLANT
GLOVE SURG UNDER POLY LF SZ8 (GLOVE) ×2
GOWN STRL REUS W/ TWL LRG LVL3 (GOWN DISPOSABLE) ×1 IMPLANT
GOWN STRL REUS W/ TWL XL LVL3 (GOWN DISPOSABLE) ×1 IMPLANT
GOWN STRL REUS W/TWL 2XL LVL3 (GOWN DISPOSABLE) IMPLANT
GOWN STRL REUS W/TWL LRG LVL3 (GOWN DISPOSABLE) ×2
GOWN STRL REUS W/TWL XL LVL3 (GOWN DISPOSABLE) ×2
HEMOSTAT POWDER KIT SURGIFOAM (HEMOSTASIS) ×2 IMPLANT
KIT BASIN OR (CUSTOM PROCEDURE TRAY) ×2 IMPLANT
KIT TURNOVER KIT B (KITS) ×2 IMPLANT
NEEDLE HYPO 25X1 1.5 SAFETY (NEEDLE) ×2 IMPLANT
NEEDLE SPNL 20GX3.5 QUINCKE YW (NEEDLE) IMPLANT
NS IRRIG 1000ML POUR BTL (IV SOLUTION) ×2 IMPLANT
PACK LAMINECTOMY NEURO (CUSTOM PROCEDURE TRAY) ×2 IMPLANT
PAD ARMBOARD 7.5X6 YLW CONV (MISCELLANEOUS) ×4 IMPLANT
STRIP CLOSURE SKIN 1/2X4 (GAUZE/BANDAGES/DRESSINGS) ×2 IMPLANT
SUT VIC AB 0 CT1 18XCR BRD8 (SUTURE) ×1 IMPLANT
SUT VIC AB 0 CT1 8-18 (SUTURE) ×2
SUT VIC AB 2-0 CP2 18 (SUTURE) ×2 IMPLANT
SUT VIC AB 3-0 SH 8-18 (SUTURE) ×2 IMPLANT
TOWEL GREEN STERILE (TOWEL DISPOSABLE) ×2 IMPLANT
TOWEL GREEN STERILE FF (TOWEL DISPOSABLE) ×2 IMPLANT
WATER STERILE IRR 1000ML POUR (IV SOLUTION) ×2 IMPLANT

## 2021-03-09 NOTE — Plan of Care (Signed)
CCMD notified pt placed on tele box 11

## 2021-03-09 NOTE — Op Note (Signed)
03/09/2021  12:13 PM  PATIENT:  [name]  75 y.o. 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 Imaging showed foraminal stenosis L5-S1 left with probable disc protrusion and osteophytic ridging. The patient tried conservative measures without relief. Pain was debilitating. Recommended left L5-S1 decompressive hemilaminectomy with possible microdiscectomy. Patient understood the risks, benefits, and alternatives and potential outcomes and wished to proceed.  PROCEDURE DETAILS: The patient was taken to the operating room and after induction of adequate generalized endotracheal anesthesia, the patient was rolled into the prone position on the Wilson frame and all pressure points were padded. The lumbar region was cleaned and then prepped with DuraPrep and draped in the usual sterile fashion. 5 cc of local anesthesia was injected and then a dorsal midline incision was made and carried down to the lumbo sacral fascia. The fascia was opened and the paraspinous musculature was taken down in a subperiosteal fashion to expose L5-S1 on the left. Intraoperative x-ray confirmed my level, and then I used a combination of the high-speed drill and the Kerrison punches to perform a hemilaminectomy, medial facetectomy, and foraminotomy at L5-S1 on the left.  I did a wide medial facetectomy and foraminotomies in order to identify the L5 nerve root and decompress it distally into the  foramen.  The underlying yellow ligament was opened and removed in a piecemeal fashion to expose the underlying dura and exiting nerve root. I undercut the lateral recess and dissected down until I was medial to and distal to the pedicle. The S1 nerve root was well decompressed.  I then worked along the L5 nerve root to decompress it distally into the foramen.  There was a fairly significant disc protrusion and osteophytic ridge causing foraminal stenosis and L5 nerve root compression.  We then gently retracted the nerve root medially with a retractor, coagulated the epidural venous vasculature, and incised the disc space.  We performed a thorough intradiscal discectomy with pituitary rongeurs and curettes, until I had a nice decompression of the L5 and S1 nerve root and the midline.  I removed an osteophytic ridge from the inferior posterior endplate of L5 that was compressing the L5 nerve root I then palpated with a coronary dilator along the nerve root and into the foramen to assure adequate decompression. I felt no more compression of the nerve root. I irrigated with saline solution containing bacitracin. Achieved hemostasis with bipolar cautery, lined the dura with Gelfoam, and then closed the fascia with 0 Vicryl. I closed the subcutaneous tissues with 2-0 Vicryl and the subcuticular tissues with 3-0 Vicryl. The skin was then closed with benzoin and Steri-Strips. The drapes were removed, a sterile dressing was applied.  My nurse practitioner was involved in the exposure, safe retraction of the neural elements, the disc work and the closure. the patient was awakened from general anesthesia and transferred to the recovery room in stable condition. At the end of the procedure all sponge, needle and instrument counts were correct.    PLAN OF CARE:  Admit for overnight observation  PATIENT DISPOSITION:  PACU - hemodynamically stable.   Delay start of Pharmacological VTE agent (>24hrs) due to surgical blood  loss or risk of bleeding:  yes

## 2021-03-09 NOTE — Transfer of Care (Signed)
Immediate Anesthesia Transfer of Care Note  Patient: [name]  Procedure(s) Performed: Laminectomy and Foraminotomy - left - Lumbar five-Sacral one (Left: Back)  Patient Location: PACU  Anesthesia Type:General  Level of Consciousness: awake, oriented, drowsy, patient cooperative and responds to stimulation  Airway & Oxygen Therapy: Patient Spontanous Breathing and Patient connected to nasal cannula oxygen  Post-op Assessment: Report given to RN, Post -op Vital signs reviewed and stable, Patient moving all extremities X 4 and Patient able to stick tongue midline  Post vital signs: stable  Last Vitals:  Vitals Value Taken Time  BP 142/65 03/09/21 1225  Temp 97.0   Pulse 65 03/09/21 1227  Resp 14 03/09/21 1227  SpO2 94 % 03/09/21 1227  Vitals shown include unvalidated device data.  Last Pain:  Vitals:   03/09/21 0836  TempSrc:   PainSc: 0-No pain         Complications: No notable events documented.

## 2021-03-09 NOTE — Progress Notes (Signed)
Ekg verified new onset a fib / dr c green aware and she has spoken with attending dr d Yetta Barre who will contact cards

## 2021-03-09 NOTE — Progress Notes (Signed)
MD Yetta Barre notified regarding patient transfer to progressive per cardiology.

## 2021-03-09 NOTE — Anesthesia Procedure Notes (Signed)
Procedure Name: Intubation Date/Time: 03/09/2021 10:26 AM Performed by: Cy Blamer, CRNA Pre-anesthesia Checklist: Patient identified, Emergency Drugs available, Suction available and Patient being monitored Patient Re-evaluated:Patient Re-evaluated prior to induction Oxygen Delivery Method: Circle system utilized Preoxygenation: Pre-oxygenation with 100% oxygen Induction Type: IV induction Ventilation: Mask ventilation without difficulty Laryngoscope Size: Miller and 2 Grade View: Grade II Tube type: Oral Tube size: 7.0 mm Number of attempts: 1 Airway Equipment and Method: Stylet and Bite block Placement Confirmation: ETT inserted through vocal cords under direct vision, positive ETCO2 and breath sounds checked- equal and bilateral Secured at: 21 cm Tube secured with: Tape Dental Injury: Teeth and Oropharynx as per pre-operative assessment

## 2021-03-09 NOTE — Anesthesia Preprocedure Evaluation (Signed)
Anesthesia Evaluation  Patient identified by MRN, date of birth, ID band Patient awake    Reviewed: Allergy & Precautions, NPO status , Patient's Chart, lab work & pertinent test results  History of Anesthesia Complications (+) PONV  Airway Mallampati: II  TM Distance: >3 FB     Dental   Pulmonary neg pulmonary ROS,    breath sounds clear to auscultation       Cardiovascular hypertension,  Rhythm:Regular Rate:Normal     Neuro/Psych negative neurological ROS     GI/Hepatic negative GI ROS, (+) Hepatitis -  Endo/Other    Renal/GU      Musculoskeletal  (+) Arthritis ,   Abdominal   Peds  Hematology   Anesthesia Other Findings   Reproductive/Obstetrics                             Anesthesia Physical Anesthesia Plan  ASA: 3  Anesthesia Plan: General   Post-op Pain Management:    Induction: Intravenous  PONV Risk Score and Plan: 4 or greater and Dexamethasone, Ondansetron and Midazolam  Airway Management Planned: Oral ETT  Additional Equipment:   Intra-op Plan:   Post-operative Plan: Extubation in OR  Informed Consent: I have reviewed the patients History and Physical, chart, labs and discussed the procedure including the risks, benefits and alternatives for the proposed anesthesia with the patient or authorized representative who has indicated his/her understanding and acceptance.       Plan Discussed with: CRNA and Anesthesiologist  Anesthesia Plan Comments:         Anesthesia Quick Evaluation

## 2021-03-09 NOTE — Consult Note (Signed)
Cardiology Consultation:   Patient ID: DEFNE CAMBRIDGE MRN: [id number]; DOB: November 21, 1945  Admit date: 03/09/2021 Date of Consult: 03/09/2021  PCP:  Delano Metz, MD   [facility] Providers Cardiologist:  None      Patient Profile:   [name] is a 75 y.o. female with a history of arthritis, hyperlipidemia, HTN who is being seen 03/09/2021 for the evaluation of new onset atrial fibrillation at the request of Dr. Ronnald Ramp..  History of Present Illness:   [name] is a 75 yo female with history of arthritis, HLD, HTN who was here today for planned L5-S1 laminectomy per Dr. Ronnald Ramp with symptoms of severe left leg pain. She had a successful procedure and while in the PACU she was noted to have atrial fibrillation with RVR.  She is being admitted by the neurosurgery service. She has no complaints. She denies having had any prior arrhythmia or any heart issues. Her EKG last week showed sinus bradycardia.    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   Hyperlipemia    Hypertension    PONV (postoperative nausea and vomiting)    scop patch before prior surgeries    Past Surgical History:  Procedure Laterality Date   ABDOMINAL HYSTERECTOMY  04/19/1993   APPENDECTOMY  04/19/1972   CHOLECYSTECTOMY  04/19/1994   COLONOSCOPY     JOINT REPLACEMENT     lft knee 2005, rt knee 2006   KNEE ARTHROSCOPY     spacer on lft knee   RECTAL SURGERY     fissure   TONSILLECTOMY     TOTAL KNEE REVISION  08/17/2011   Procedure: TOTAL KNEE REVISION;  Surgeon: Garald Balding, MD;  Location: [place];  Service: Orthopedics;  Laterality: Left;  left total knee revision with removal of antibiotic spacer     Home Medications:  Prior to Admission medications   Medication Sig Start Date End Date Taking? Authorizing Provider  Calcium Carbonate (CALCIUM 600 PO) Take 600 mg by mouth daily.   Yes  [provider]  celecoxib (CELEBREX) 200 MG capsule Take 200 mg by mouth 2 (two) times daily.   Yes [provider]  Cholecalciferol (VITAMIN D3) 125 MCG (5000 UT) CAPS Take 5,000 Units by mouth daily.   Yes [provider]  gabapentin (NEURONTIN) 600 MG tablet Take 600 mg by mouth 3 (three) times daily.   Yes [provider]  nebivolol (BYSTOLIC) 5 MG tablet Take 5 mg by mouth daily.   Yes [provider]  oxybutynin (DITROPAN-XL) 10 MG 24 hr tablet Take 10 mg by mouth at bedtime. 02/18/21  Yes [provider]  Turmeric 500 MG CAPS Take 500 mg by mouth daily.   Yes [provider]  docusate sodium (COLACE) 100 MG capsule Take 100 mg by mouth daily as needed for moderate constipation.    [provider]    Inpatient Medications: Scheduled Meds:  Chlorhexidine Gluconate Cloth  6 each Topical Once   And   Chlorhexidine Gluconate Cloth  6 each Topical Once   gabapentin  300 mg Oral To OR   Continuous Infusions:  lactated ringers Stopped (03/09/21 1214)   PRN Meds: fentaNYL (SUBLIMAZE) injection, fentaNYL (SUBLIMAZE) injection  Allergies:   No Known Allergies  Social History:   Social History   Socioeconomic History   Marital status: Married  Spouse name: Not on file   Number of children: Not on file   Years of education: Not on file   Highest education level: Not on file  Occupational History   Not on file  Tobacco Use   Smoking status: Never   Smokeless tobacco: Not on file  Vaping Use   Vaping Use: Never used  Substance and Sexual Activity   Alcohol use: No   Drug use: No    Types: Hydrocodone   Sexual activity: Yes    Comment: hysterectomy  Other Topics Concern   Not on file  Social History Narrative   Not on file   Social Determinants of Health   Financial Resource Strain: Not on file  Food Insecurity: Not on file  Transportation Needs: Not on file  Physical Activity: Not on file   Stress: Not on file  Social Connections: Not on file  Intimate Partner Violence: Not on file    Family History:   History reviewed. No pertinent family history.  No cardiac disease in family per pt  ROS:  Please see the history of present illness.  All other ROS reviewed and negative.     Physical Exam/Data:   Vitals:   03/09/21 1241 03/09/21 1255 03/09/21 1310 03/09/21 1325  BP: 130/70 124/89 116/79 118/81  Pulse: 75 89 89 98  Resp: 15 17 14 14   Temp:   98 F (36.7 C)   TempSrc:      SpO2: 98% 98% 93% 95%  Weight:      Height:        Intake/Output Summary (Last 24 hours) at 03/09/2021 1417 Last data filed at 03/09/2021 1214 Gross per 24 hour  Intake 800 ml  Output 100 ml  Net 700 ml   Last 3 Weights 03/09/2021 03/05/2021 08/18/2011  Weight (lbs) 202 lb 202 lb 190 lb  Weight (kg) 91.627 kg 91.627 kg 86.183 kg     Body mass index is 32.6 kg/m.  General:  Well nourished, well developed, in no acute distress HEENT: normal Neck: no JVD Vascular: No carotid bruits; Distal pulses 2+ bilaterally Cardiac:  Irreg irreg. no murmur  Lungs:  clear to auscultation bilaterally, no wheezing, rhonchi or rales  Abd: soft, nontender, no hepatomegaly  Ext: no edema Musculoskeletal:  No deformities, BUE and BLE strength normal and equal Skin: warm and dry  Neuro:  CNs 2-12 intact, no focal abnormalities noted Psych:  Normal affect   EKG:  The EKG was personally reviewed and demonstrates:  atrial fib, rate 120s Telemetry:  Telemetry was personally reviewed and demonstrates:  Atrial fib rate 120s  Relevant CV Studies:   Laboratory Data:  High Sensitivity Troponin:  No results for input(s): TROPONINIHS in the last 720 hours.   Chemistry Recent Labs  Lab 03/05/21 1311  NA 134*  K 4.2  CL 101  CO2 25  GLUCOSE 95  BUN 23  CREATININE 1.33*  CALCIUM 9.0  GFRNONAA 41*  ANIONGAP 8    Recent Labs  Lab 03/05/21 1311  PROT 7.0  ALBUMIN 3.9  AST 26  ALT 20  ALKPHOS  118  BILITOT 1.0   Lipids No results for input(s): CHOL, TRIG, HDL, LABVLDL, LDLCALC, CHOLHDL in the last 168 hours.  Hematology Recent Labs  Lab 03/05/21 1311  WBC 5.7  RBC 3.67*  HGB 12.2  HCT 36.9  MCV 100.5*  MCH 33.2  MCHC 33.1  RDW 13.2  PLT 172   Thyroid No results for input(s): TSH, FREET4 in  the last 168 hours.  BNPNo results for input(s): BNP, PROBNP in the last 168 hours.  DDimer No results for input(s): DDIMER in the last 168 hours.   Radiology/Studies:  No results found.   Assessment and Plan:   Atrial fibrillation with RVR: New onset, She was in sinus last week on pre-op EKG. I have reviewed the etiology and treatment options for atrial fibrillation with [name]. I will give a 10 mg bolus of Diltiazem and then start a drip at 5 mg/hour. She will need to be admitted to a telemetry unit. Echo tomorrow. BMET today to check electrolytes. EKG in am. Will not start anti-coagulation given her neurosurgical procedure but if her atrial fibrillation persists, may need to consider long term anti-coagulation as an outpatient.    Risk Assessment/Risk Scores:  { For questions or updates, please contact [place] Please consult [email or web address] for contact info under    Signed, Lauree Chandler, MD  03/09/2021 2:17 PM

## 2021-03-09 NOTE — Anesthesia Postprocedure Evaluation (Signed)
Anesthesia Post Note  Patient: [name]  Procedure(s) Performed: Laminectomy and Foraminotomy - left - Lumbar five-Sacral one (Left: Back)     Patient location during evaluation: PACU Anesthesia Type: General Level of consciousness: awake Pain management: pain level controlled Vital Signs Assessment: post-procedure vital signs reviewed and stable Respiratory status: spontaneous breathing Cardiovascular status: stable Postop Assessment: no apparent nausea or vomiting Anesthetic complications: no   No notable events documented.  Last Vitals:  Vitals:   03/09/21 1855 03/09/21 2049  BP: (!) 150/82 (!) 137/93  Pulse: 72 80  Resp: 16 17  Temp: (!) 36.4 C 36.4 C  SpO2: 95% 98%    Last Pain:  Vitals:   03/09/21 2049  TempSrc: Oral  PainSc:                  [name]

## 2021-03-09 NOTE — H&P (Signed)
Subjective: Patient is a 75 y.o. female admitted for left leg pain. Onset of symptoms was several months ago, gradually worsening since that time.  The pain is rated severe, and is located at the across the lower back and radiates to LLE. The pain is described as aching and occurs all day. The symptoms have been progressive. Symptoms are exacerbated by exercise, standing, and walking for more than a few minutes. 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   Hyperlipemia    Hypertension    PONV (postoperative nausea and vomiting)    scop patch before prior surgeries    Past Surgical History:  Procedure Laterality Date   ABDOMINAL HYSTERECTOMY  04/19/1993   APPENDECTOMY  04/19/1972   CHOLECYSTECTOMY  04/19/1994   COLONOSCOPY     JOINT REPLACEMENT     lft knee 2005, rt knee 2006   KNEE ARTHROSCOPY     spacer on lft knee   RECTAL SURGERY     fissure   TONSILLECTOMY     TOTAL KNEE REVISION  08/17/2011   Procedure: TOTAL KNEE REVISION;  Surgeon: Garald Balding, MD;  Location: [place];  Service: Orthopedics;  Laterality: Left;  left total knee revision with removal of antibiotic spacer    Prior to Admission medications   Medication Sig Start Date End Date Taking? Authorizing Provider  Calcium Carbonate (CALCIUM 600 PO) Take 600 mg by mouth daily.   Yes [provider]  celecoxib (CELEBREX) 200 MG capsule Take 200 mg by mouth 2 (two) times daily.   Yes [provider]  Cholecalciferol (VITAMIN D3) 125 MCG (5000 UT) CAPS Take 5,000 Units by mouth daily.   Yes [provider]  gabapentin (NEURONTIN) 600 MG tablet Take 600 mg by mouth 3 (three) times daily.   Yes [provider]  nebivolol (BYSTOLIC) 5 MG tablet Take 5 mg by mouth daily.   Yes [provider]  oxybutynin (DITROPAN-XL) 10 MG 24 hr tablet  Take 10 mg by mouth at bedtime. 02/18/21  Yes [provider]  Turmeric 500 MG CAPS Take 500 mg by mouth daily.   Yes [provider]  docusate sodium (COLACE) 100 MG capsule Take 100 mg by mouth daily as needed for moderate constipation.    [provider]   No Known Allergies  Social History   Tobacco Use   Smoking status: Never   Smokeless tobacco: Not on file  Substance Use Topics   Alcohol use: No    History reviewed. No pertinent family history.   Review of Systems  Positive ROS: neg  All other systems have been reviewed and were otherwise negative with the exception of those mentioned in the HPI and as above.  Objective: Vital signs in last 24 hours: Temp:  [97.8 F (36.6 C)] 97.8 F (36.6 C) (11/21 0806) Pulse Rate:  [72] 72 (11/21 0806) Resp:  [17] 17 (11/21 0806) BP: (161)/(76) 161/76 (11/21 0806) SpO2:  [98 %] 98 % (11/21 0806) Weight:  [91.6 kg] 91.6 kg (11/21 0806)  General Appearance: Alert, cooperative, no distress, appears stated age Head: Normocephalic, without obvious abnormality, atraumatic Eyes: PERRL, conjunctiva/corneas clear, EOM's intact    Neck: Supple, symmetrical, trachea midline Back: Symmetric, no curvature, ROM normal, no CVA tenderness Lungs:  respirations unlabored Heart: Regular rate  and rhythm Abdomen: Soft, non-tender Extremities: Extremities normal, atraumatic, no cyanosis or edema Pulses: 2+ and symmetric all extremities Skin: Skin color, texture, turgor normal, no rashes or lesions  NEUROLOGIC:   Mental status: Alert and oriented x4,  no aphasia, good attention span, fund of knowledge, and memory Motor Exam - grossly normal Sensory Exam - grossly normal Reflexes: 1+ Coordination - grossly normal Gait - grossly normal Balance - grossly normal Cranial Nerves: I: smell Not tested  II: visual acuity  OS: nl    OD: nl  II: visual fields Full to confrontation  II: pupils Equal, round, reactive to light   III,VII: ptosis None  III,IV,VI: extraocular muscles  Full ROM  V: mastication Normal  V: facial light touch sensation  Normal  V,VII: corneal reflex  Present  VII: facial muscle function - upper  Normal  VII: facial muscle function - lower Normal  VIII: hearing Not tested  IX: soft palate elevation  Normal  IX,X: gag reflex Present  XI: trapezius strength  5/5  XI: sternocleidomastoid strength 5/5  XI: neck flexion strength  5/5  XII: tongue strength  Normal    Data Review Lab Results  Component Value Date   WBC 5.7 03/05/2021   HGB 12.2 03/05/2021   HCT 36.9 03/05/2021   MCV 100.5 (H) 03/05/2021   PLT 172 03/05/2021   Lab Results  Component Value Date   NA 134 (L) 03/05/2021   K 4.2 03/05/2021   CL 101 03/05/2021   CO2 25 03/05/2021   BUN 23 03/05/2021   CREATININE 1.33 (H) 03/05/2021   GLUCOSE 95 03/05/2021   Lab Results  Component Value Date   INR 1.1 03/05/2021    Assessment/Plan:  Estimated body mass index is 32.6 kg/m as calculated from the following:   Height as of this encounter: 5\' 6"  (1.676 m).   Weight as of this encounter: 91.6 kg. Patient admitted for L L5-S1 hemilaminectomy. Patient has failed a reasonable attempt at conservative therapy.  I explained the condition and procedure to the patient and answered any questions.  Patient wishes to proceed with procedure as planned. Understands risks/ benefits and typical outcomes of procedure.   03/09/2021 10:06 AM

## 2021-03-10 ENCOUNTER — Ambulatory Visit (INDEPENDENT_AMBULATORY_CARE_PROVIDER_SITE_OTHER): Payer: Medicare Other

## 2021-03-10 ENCOUNTER — Other Ambulatory Visit: Payer: Self-pay | Admitting: Cardiology

## 2021-03-10 ENCOUNTER — Encounter (HOSPITAL_COMMUNITY): Payer: Self-pay | Admitting: Neurological Surgery

## 2021-03-10 ENCOUNTER — Ambulatory Visit (HOSPITAL_BASED_OUTPATIENT_CLINIC_OR_DEPARTMENT_OTHER): Payer: Medicare Other

## 2021-03-10 ENCOUNTER — Encounter: Payer: Self-pay | Admitting: *Deleted

## 2021-03-10 DIAGNOSIS — I10 Essential (primary) hypertension: Secondary | ICD-10-CM | POA: Insufficient documentation

## 2021-03-10 DIAGNOSIS — I080 Rheumatic disorders of both mitral and aortic valves: Secondary | ICD-10-CM | POA: Insufficient documentation

## 2021-03-10 DIAGNOSIS — I480 Paroxysmal atrial fibrillation: Secondary | ICD-10-CM

## 2021-03-10 DIAGNOSIS — I4891 Unspecified atrial fibrillation: Secondary | ICD-10-CM | POA: Insufficient documentation

## 2021-03-10 DIAGNOSIS — M5416 Radiculopathy, lumbar region: Secondary | ICD-10-CM | POA: Diagnosis not present

## 2021-03-10 DIAGNOSIS — I9789 Other postprocedural complications and disorders of the circulatory system, not elsewhere classified: Secondary | ICD-10-CM

## 2021-03-10 DIAGNOSIS — E785 Hyperlipidemia, unspecified: Secondary | ICD-10-CM | POA: Insufficient documentation

## 2021-03-10 DIAGNOSIS — M4807 Spinal stenosis, lumbosacral region: Secondary | ICD-10-CM | POA: Diagnosis not present

## 2021-03-10 LAB — ECHOCARDIOGRAM COMPLETE
Area-P 1/2: 3.72 cm2
Calc EF: 52.4 %
Height: 66 in
S' Lateral: 2.6 cm
Single Plane A2C EF: 50.2 %
Single Plane A4C EF: 52.5 %
Weight: 3232 oz

## 2021-03-10 MED ORDER — HYDROCODONE-ACETAMINOPHEN 7.5-325 MG PO TABS: 1.0000 | ORAL_TABLET | Freq: Four times a day (QID) | ORAL | 0 refills | Status: AC

## 2021-03-10 NOTE — Progress Notes (Signed)
Pt discharged to home. DC instructions given with spouse at bedside. No concerns voiced. Pt left unit in wheelchair pushed by nurse tech. Left in stable condition.

## 2021-03-10 NOTE — Progress Notes (Signed)
Mobility Specialist Progress Note   03/10/21 1655  Mobility  Activity Refused mobility   Pt and husband claimed to be going home today and wanting to save there energy for their transport home.  Frederico Hamman Mobility Specialist Phone Number [phone]

## 2021-03-10 NOTE — Evaluation (Signed)
Physical Therapy Evaluation Patient Details Name: [name] MRN: [id number] DOB: 1945/10/28 Today's Date: 03/10/2021  History of Present Illness  Patient is a 75 y/o female who presents s/p L5-S1 laminectomy and foraminotomy 03/09/21. PMH includes global amnesia, HTN.  Clinical Impression  Patient presents with post surgical deficits and impaired mobility s/p above. Pt lives at home with her spouse and reports being independent foR ADLs/ambulation PTA. Today, pt requires Min guard assist and use of RW for support. Tolerated transfers and gait training with mild balance deficits noted walking without use of DME. Education re: back precautions, positioning, log roll technique, mobility recommendations, etc. Pt will likely progress well with increased activity. Recommend use of RW initially at home for safety. Will follow acutely to maximize independence and mobility prior to return home.       Recommendations for follow up therapy are one component of a multi-disciplinary discharge planning process, led by the attending physician.  Recommendations may be updated based on patient status, additional functional criteria and insurance authorization.  Follow Up Recommendations No PT follow up    Assistance Recommended at Discharge PRN  Functional Status Assessment Patient has had a recent decline in their functional status and demonstrates the ability to make significant improvements in function in a reasonable and predictable amount of time.  Equipment Recommendations  None recommended by PT    Recommendations for Other Services       Precautions / Restrictions Precautions Precautions: Back Precaution Booklet Issued: Yes (comment) Precaution Comments: Reviewed back precautions Restrictions Weight Bearing Restrictions: No      Mobility  Bed Mobility Overal bed mobility: Needs Assistance Bed Mobility: Rolling;Sidelying to Sit Rolling: Supervision Sidelying to sit: Supervision;HOB  elevated       General bed mobility comments: Cues for log roll technique, use of rail.    Transfers Overall transfer level: Needs assistance Equipment used: Rolling walker (2 wheels);None Transfers: Sit to/from Stand Sit to Stand: Min guard           General transfer comment: Min guard for safety, Stood from EOB x1, from toilet x1, transferred to chair post ambulation.    Ambulation/Gait Ambulation/Gait assistance: Min guard Gait Distance (Feet): 150 Feet Assistive device: Rolling walker (2 wheels) Gait Pattern/deviations: Step-through pattern;Decreased stride length Gait velocity: decreased     General Gait Details: Slow, steady gait with RW for support, cues for upright. HR stable 70s-80s bpm. Initially holding onto furniture/IV pole and slightly unsteady, improved with RW  Stairs            Wheelchair Mobility    Modified Rankin (Stroke Patients Only)       Balance Overall balance assessment: Mild deficits observed, not formally tested                                           Pertinent Vitals/Pain Pain Assessment: No/denies pain    Home Living Family/patient expects to be discharged to:: Private residence Living Arrangements: Spouse/significant other Available Help at Discharge: Family;Available 24 hours/day Type of Home: House Home Access: Stairs to enter Entrance Stairs-Rails: None Entrance Stairs-Number of Steps: 1   Home Layout: One level;Laundry or work area in [place]: Grab bars - Agricultural consultant (2 wheels);Wheelchair - manual      Prior Function Prior Level of Function : Independent/Modified Independent  Mobility Comments: Independent for ambulation, holds onto the buggy in store. 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 ADLs Comments: independent     Hand Dominance   Dominant Hand: Left     Extremity/Trunk Assessment   Upper Extremity Assessment Upper Extremity Assessment: Defer to OT evaluation    Lower Extremity Assessment Lower Extremity Assessment: Overall WFL for tasks assessed    Cervical / Trunk Assessment Cervical / Trunk Assessment: Back Surgery  Communication   Communication: No difficulties  Cognition Arousal/Alertness: Awake/alert Behavior During Therapy: WFL for tasks assessed/performed Overall Cognitive Status: Within Functional Limits for tasks assessed                                          General Comments General comments (skin integrity, edema, etc.): No drainage with incision. Spouse present.    Exercises     Assessment/Plan    PT Assessment Patient needs continued PT services  PT Problem List Decreased mobility;Decreased balance;Decreased skin integrity;Decreased knowledge of precautions       PT Treatment Interventions Therapeutic activities;Gait training;Functional mobility training;Balance training;Patient/family education;Therapeutic exercise    PT Goals (Current goals can be found in the Care Plan section)  Acute Rehab PT Goals Patient Stated Goal: to go home today and get to baking my cakes PT Goal Formulation: With patient Time For Goal Achievement: 03/24/21 Potential to Achieve Goals: Good    Frequency Min 5X/week   Barriers to discharge        Co-evaluation               AM-PAC PT "6 Clicks" Mobility  Outcome Measure Help needed turning from your back to your side while in a flat bed without using bedrails?: A Little Help needed moving from lying on your back to sitting on the side of a flat bed without using bedrails?: A Little Help needed moving to and from a bed to a chair (including a wheelchair)?: A Little Help needed standing up from a chair using your arms (e.g., wheelchair or bedside chair)?: A Little Help needed to walk in hospital room?: A Little Help needed climbing 3-5 steps with  a railing? : A Little 6 Click Score: 18    End of Session Equipment Utilized During Treatment: Gait belt Activity Tolerance: Patient tolerated treatment well Patient left: in chair;with call bell/phone within reach;with family/visitor present Nurse Communication: Mobility status PT Visit Diagnosis: Unsteadiness on feet (R26.81)    Time: 0998-3382 PT Time Calculation (min) (ACUTE ONLY): 20 min   Charges:   PT Evaluation $PT Eval Moderate Complexity: 1 Mod          Vale Haven, PT, DPT Acute Rehabilitation Services Pager [phone] Office [phone]     Blake Divine A Lanier Ensign 03/10/2021, 8:50 AM

## 2021-03-10 NOTE — Progress Notes (Signed)
  Echocardiogram 2D Echocardiogram has been performed.  Augustine Radar 03/10/2021, 9:59 AM

## 2021-03-10 NOTE — Discharge Summary (Signed)
Discharge Summary  Date of Admission: 03/09/2021  Date of Discharge: 03/10/21  Attending Physician: Marikay Alar, MD  Hospital Course: Patient was admitted following an uncomplicated left L5-S1 LMD. Post-operatively, she had post-op Afib with RVR. Cardiology was consulted and started a dilt gtt. She cardioverted and remained in NSR. The dilt gtt was stopped, an echo showed normal EF with some diastolic dysfunction, and outpt follow up was arranged with cards for zio patch monitoring. Her leg pain was immediately relieved post-op and, from a neurosurgical perspective, did great. Her hospital course was otherwise uncomplicated and the patient was discharged home on 03/10/21. She will follow up in clinic with Dr. Yetta Barre as an outpatient.  Neurologic exam at discharge:  Strength 5/5 x4, SILTx4  Discharge diagnosis: Lumbar radiculopathy  Jadene Pierini, MD 03/10/21 6:06 PM

## 2021-03-10 NOTE — Progress Notes (Signed)
Progress Note  Patient Name: [name] Date of Encounter: 03/10/2021  [facility] Cardiologist: None   Subjective   No chest pain or shortness of breath.  Feeling well.  Eager to go home.  Inpatient Medications    Scheduled Meds:  celecoxib  200 mg Oral BID   dexamethasone (DECADRON) injection  4 mg Intravenous Q6H   Or   dexamethasone  4 mg Oral Q6H   diltiazem  10 mg Intravenous Once   gabapentin  600 mg Oral TID   HYDROcodone-acetaminophen  1 tablet Oral Q6H   nebivolol  5 mg Oral Daily   oxybutynin  10 mg Oral QHS   senna  1 tablet Oral BID   sodium chloride flush  3 mL Intravenous Q12H   Continuous Infusions:  sodium chloride Stopped (03/10/21 0600)   0.9 % NaCl with KCl 20 mEq / L 75 mL/hr at 03/09/21 2122   diltiazem (CARDIZEM) infusion     methocarbamol (ROBAXIN) IV     PRN Meds: acetaminophen **OR** acetaminophen, docusate sodium, menthol-cetylpyridinium **OR** phenol, methocarbamol **OR** methocarbamol (ROBAXIN) IV, morphine injection, ondansetron **OR** ondansetron (ZOFRAN) IV, sodium chloride flush   Vital Signs    Vitals:   03/09/21 1855 03/09/21 2049 03/10/21 0348 03/10/21 0848  BP: (!) 150/82 (!) 137/93 120/66 135/72  Pulse: 72 80 70 67  Resp: 16 17 17 17   Temp: (!) 97.5 F (36.4 C) 97.6 F (36.4 C) 98.3 F (36.8 C) 98.2 F (36.8 C)  TempSrc: Oral Oral Oral Oral  SpO2: 95% 98% 95% 96%  Weight:      Height:        Intake/Output Summary (Last 24 hours) at 03/10/2021 1334 Last data filed at 03/10/2021 1158 Gross per 24 hour  Intake 360 ml  Output 2 ml  Net 358 ml   Last 3 Weights 03/09/2021 03/05/2021 08/18/2011  Weight (lbs) 202 lb 202 lb 190 lb  Weight (kg) 91.627 kg 91.627 kg 86.183 kg      Telemetry    Sinus rhythm with no arrhythmia.  No further atrial fibrillation- Personally Reviewed   Physical Exam  Alert, oriented, no distress GEN: No acute distress.   Neck: No JVD Cardiac: RRR, no murmurs, rubs, or gallops.   Respiratory: Clear to auscultation bilaterally. GI: Soft, nontender, non-distended  MS: No edema; No deformity. Neuro:  Nonfocal  Psych: Normal affect   Labs    High Sensitivity Troponin:  No results for input(s): TROPONINIHS in the last 720 hours.   Chemistry Recent Labs  Lab 03/05/21 1311 03/09/21 1846  NA 134* 132*  K 4.2 4.4  CL 101 99  CO2 25 24  GLUCOSE 95 151*  BUN 23 20  CREATININE 1.33* 1.30*  CALCIUM 9.0 8.7*  PROT 7.0  --   ALBUMIN 3.9  --   AST 26  --   ALT 20  --   ALKPHOS 118  --   BILITOT 1.0  --   GFRNONAA 41* 43*  ANIONGAP 8 9    Lipids No results for input(s): CHOL, TRIG, HDL, LABVLDL, LDLCALC, CHOLHDL in the last 168 hours.  Hematology Recent Labs  Lab 03/05/21 1311  WBC 5.7  RBC 3.67*  HGB 12.2  HCT 36.9  MCV 100.5*  MCH 33.2  MCHC 33.1  RDW 13.2  PLT 172   Thyroid No results for input(s): TSH, FREET4 in the last 168 hours.  BNPNo results for input(s): BNP, PROBNP in the last 168 hours.  DDimer No results  for input(s): DDIMER in the last 168 hours.   Radiology    DG Lumbar Spine 2-3 Views  Result Date: 03/09/2021 CLINICAL DATA:  Intraoperative imaging for localization EXAM: LUMBAR SPINE - 2-3 VIEW COMPARISON:  X-ray lumbar spine 02/19/2021 FINDINGS: Intraoperative spine surgery with surgical instrument posterior to the lower S1 level. IMPRESSION: Intraoperative spine surgery with surgical instrument posterior to the lower S1 level. Electronically Signed   By: Iven Finn M.D.   On: 03/09/2021 15:08   ECHOCARDIOGRAM COMPLETE  Result Date: 03/10/2021    ECHOCARDIOGRAM REPORT   Patient Name:   [name] Date of Exam: 03/10/2021 Medical Rec #:  [id number]     Height:       66.0 in Accession #:    [id number]    Weight:       202.0 lb Date of Birth:  [birth year]     BSA:          2.008 m Patient Age:    75 years      BP:           120/66 mmHg Patient Gender: F             HR:           60 bpm. Exam Location:  Inpatient Procedure: 2D  Echo, Cardiac Doppler and Color Doppler Indications:    I48.91* Unspeicified atrial fibrillation  History:        Patient has no prior history of Echocardiogram examinations.                 Risk Factors:Dyslipidemia and Hypertension.  Sonographer:    Bernadene Person RDCS Referring Phys: [place]  1. Left ventricular ejection fraction, by estimation, is 60 to 65%. The left ventricle has normal function. The left ventricle has no regional wall motion abnormalities. Left ventricular diastolic parameters are consistent with Grade II diastolic dysfunction (pseudonormalization).  2. Right ventricular systolic function is normal. The right ventricular size is normal. There is normal pulmonary artery systolic pressure. The estimated right ventricular systolic pressure is 99991111 mmHg.  3. Left atrial size was mildly dilated.  4. Right atrial size was mildly dilated.  5. The mitral valve is normal in structure. Trivial mitral valve regurgitation. No evidence of mitral stenosis.  6. The aortic valve is tricuspid. Aortic valve regurgitation is trivial.  7. The inferior vena cava is dilated in size with >50% respiratory variability, suggesting right atrial pressure of 8 mmHg. FINDINGS  Left Ventricle: Left ventricular ejection fraction, by estimation, is 60 to 65%. The left ventricle has normal function. The left ventricle has no regional wall motion abnormalities. The left ventricular internal cavity size was normal in size. There is  no left ventricular hypertrophy. Left ventricular diastolic parameters are consistent with Grade II diastolic dysfunction (pseudonormalization). Right Ventricle: The right ventricular size is normal. No increase in right ventricular wall thickness. Right ventricular systolic function is normal. There is normal pulmonary artery systolic pressure. The tricuspid regurgitant velocity is 2.32 m/s, and  with an assumed right atrial pressure of 8 mmHg, the estimated right  ventricular systolic pressure is 99991111 mmHg. Left Atrium: Left atrial size was mildly dilated. Right Atrium: Right atrial size was mildly dilated. Pericardium: There is no evidence of pericardial effusion. Mitral Valve: The mitral valve is normal in structure. Trivial mitral valve regurgitation. No evidence of mitral valve stenosis. Tricuspid Valve: The tricuspid valve is normal in structure. Tricuspid valve regurgitation is trivial. Aortic Valve:  The aortic valve is tricuspid. Aortic valve regurgitation is trivial. Pulmonic Valve: The pulmonic valve was normal in structure. Pulmonic valve regurgitation is not visualized. Aorta: The aortic root is normal in size and structure. Venous: The inferior vena cava is dilated in size with greater than 50% respiratory variability, suggesting right atrial pressure of 8 mmHg. IAS/Shunts: No atrial level shunt detected by color flow Doppler.  LEFT VENTRICLE PLAX 2D LVIDd:         3.30 cm     Diastology LVIDs:         2.60 cm     LV e' medial:    5.63 cm/s LV PW:         0.80 cm     LV E/e' medial:  12.0 LV IVS:        0.90 cm     LV e' lateral:   8.59 cm/s LVOT diam:     1.80 cm     LV E/e' lateral: 7.9 LV SV:         68 LV SV Index:   34 LVOT Area:     2.54 cm  LV Volumes (MOD) LV vol d, MOD A2C: 91.7 ml LV vol d, MOD A4C: 83.5 ml LV vol s, MOD A2C: 45.7 ml LV vol s, MOD A4C: 39.7 ml LV SV MOD A2C:     46.0 ml LV SV MOD A4C:     83.5 ml LV SV MOD BP:      48.3 ml RIGHT VENTRICLE RV S prime:     14.20 cm/s TAPSE (M-mode): 1.7 cm LEFT ATRIUM             Index        RIGHT ATRIUM           Index LA diam:        3.20 cm 1.59 cm/m   RA Area:     22.50 cm LA Vol (A2C):   58.1 ml 28.93 ml/m  RA Volume:   62.80 ml  31.27 ml/m LA Vol (A4C):   64.5 ml 32.12 ml/m LA Biplane Vol: 64.5 ml 32.12 ml/m  AORTIC VALVE LVOT Vmax:   107.00 cm/s LVOT Vmean:  79.100 cm/s LVOT VTI:    0.269 m  AORTA Ao Root diam: 3.10 cm Ao Asc diam:  3.30 cm MITRAL VALVE               TRICUSPID VALVE MV Area  (PHT): 3.72 cm    TR Peak grad:   21.5 mmHg MV Decel Time: 204 msec    TR Vmax:        232.00 cm/s MV E velocity: 67.50 cm/s MV A velocity: 58.70 cm/s  SHUNTS MV E/A ratio:  1.15        Systemic VTI:  0.27 m                            Systemic Diam: 1.80 cm Dalton McleanMD Electronically signed by Franki Monte Signature Date/Time: 03/10/2021/1:03:22 PM    Final     Cardiac Studies   2D echo images reviewed.  Formal interpretation currently pending.  Overall LV function looks grossly normal with no significant valvular disease noted.  Patient Profile     75 y.o. female with a brief episode of postoperative atrial fibrillation after undergoing laminectomy  Assessment & Plan    Postoperative atrial fibrillation: Patient maintaining sinus rhythm and appears to be doing well.  Will arrange a 14-day ZIO monitor that will be mailed to her for outpatient monitoring.  Will arrange cardiology follow-up in the atrial fibrillation clinic.  Echo is now formally interpreted.  Reading as above with normal LVEF, grade 2 diastolic dysfunction, and no significant valvular disease.  From our perspective she is stable for discharge today.  [facility] will sign off.   Medication Recommendations:  none Other recommendations (labs, testing, etc): We will arrange a 14-day cardiac monitor Follow up as an outpatient:  will arrange in atrial fibrillation clinic  For questions or updates, please contact [facility] Please consult [email or web address] for contact info under        Signed, Sherren Mocha, MD  03/10/2021, 1:34 PM

## 2021-03-10 NOTE — Progress Notes (Signed)
Subjective: Patient reports she feels the best she's felt in months, no leg pain or NTW, walking halls with PT  Objective: Vital signs in last 24 hours: Temp:  [97.5 F (36.4 C)-98.3 F (36.8 C)] 98.3 F (36.8 C) (11/22 0348) Pulse Rate:  [62-121] 70 (11/22 0348) Resp:  [11-29] 17 (11/22 0348) BP: (111-161)/(65-98) 120/66 (11/22 0348) SpO2:  [93 %-100 %] 95 % (11/22 0348) Weight:  [91.6 kg] 91.6 kg (11/21 0806)  Intake/Output from previous day: 11/21 0701 - 11/22 0700 In: 800 [I.V.:800] Out: 102 [Urine:1; Stool:1; Blood:100] Intake/Output this shift: No intake/output data recorded.  Neurologic: Grossly normal  Lab Results: Lab Results  Component Value Date   WBC 5.7 03/05/2021   HGB 12.2 03/05/2021   HCT 36.9 03/05/2021   MCV 100.5 (H) 03/05/2021   PLT 172 03/05/2021   Lab Results  Component Value Date   INR 1.1 03/05/2021   BMET Lab Results  Component Value Date   NA 132 (L) 03/09/2021   K 4.4 03/09/2021   CL 99 03/09/2021   CO2 24 03/09/2021   GLUCOSE 151 (H) 03/09/2021   BUN 20 03/09/2021   CREATININE 1.30 (H) 03/09/2021   CALCIUM 8.7 (L) 03/09/2021    Studies/Results: DG Lumbar Spine 2-3 Views  Result Date: 03/09/2021 CLINICAL DATA:  Intraoperative imaging for localization EXAM: LUMBAR SPINE - 2-3 VIEW COMPARISON:  X-ray lumbar spine 02/19/2021 FINDINGS: Intraoperative spine surgery with surgical instrument posterior to the lower S1 level. IMPRESSION: Intraoperative spine surgery with surgical instrument posterior to the lower S1 level. Electronically Signed   By: Tish Frederickson M.D.   On: 03/09/2021 15:08    Assessment/Plan: Doing great after surgery from neuro standpoint Home when cleared by cards  Estimated body mass index is 32.6 kg/m as calculated from the following:   Height as of this encounter: 5\' 6"  (1.676 m).   Weight as of this encounter: 91.6 kg.    LOS: 0 days    03/10/2021, 8:01 AM

## 2021-03-10 NOTE — Progress Notes (Unsigned)
Enrolled for [employer] to mail a ZIO XT long term holter monitor to the patients address on file.  Letter with instructions mailed to patient. 

## 2021-03-13 DIAGNOSIS — I480 Paroxysmal atrial fibrillation: Secondary | ICD-10-CM | POA: Diagnosis not present

## 2021-03-24 ENCOUNTER — Other Ambulatory Visit: Payer: Self-pay

## 2021-03-24 ENCOUNTER — Encounter (HOSPITAL_COMMUNITY): Payer: Self-pay | Admitting: Physician Assistant

## 2021-03-24 ENCOUNTER — Ambulatory Visit (HOSPITAL_COMMUNITY)
Admit: 2021-03-24 | Discharge: 2021-03-24 | Disposition: A | Discharge status: Home / Self Care | Payer: Medicare Other | Attending: Physician Assistant | Admitting: Physician Assistant

## 2021-03-24 VITALS — BP 114/78 | HR 63 | Ht 66.0 in | Wt 203.6 lb

## 2021-03-24 DIAGNOSIS — E669 Obesity, unspecified: Secondary | ICD-10-CM | POA: Insufficient documentation

## 2021-03-24 DIAGNOSIS — M1990 Unspecified osteoarthritis, unspecified site: Secondary | ICD-10-CM | POA: Insufficient documentation

## 2021-03-24 DIAGNOSIS — I4891 Unspecified atrial fibrillation: Secondary | ICD-10-CM | POA: Insufficient documentation

## 2021-03-24 DIAGNOSIS — I10 Essential (primary) hypertension: Secondary | ICD-10-CM | POA: Insufficient documentation

## 2021-03-24 DIAGNOSIS — I480 Paroxysmal atrial fibrillation: Secondary | ICD-10-CM

## 2021-03-24 DIAGNOSIS — Z7182 Exercise counseling: Secondary | ICD-10-CM | POA: Insufficient documentation

## 2021-03-24 DIAGNOSIS — Z6832 Body mass index (BMI) 32.0-32.9, adult: Secondary | ICD-10-CM | POA: Insufficient documentation

## 2021-03-24 DIAGNOSIS — E785 Hyperlipidemia, unspecified: Secondary | ICD-10-CM | POA: Insufficient documentation

## 2021-03-24 NOTE — Progress Notes (Signed)
Primary Care Physician: Kem Boroughs, MD Primary Cardiologist: Dr Clifton James  Primary Electrophysiologist: none Referring Physician: Dr Shea Stakes is a 75 y.o. female with a history of HLD, HTN, arthritis, and postoperative atrial fibrillation who presents for consultation in the [facility].  The patient was initially diagnosed with atrial fibrillation 03/09/21 while in recovery after L5-S1 laminectomy. IV diltiazem was ordered and she converted back to SR. Patient has a CHADS2VASC score of 4. A Zio monitor was mailed to her and she is currently wearing this. She denies any heart racing or palpitations.   Today, she denies symptoms of palpitations, chest pain, shortness of breath, orthopnea, PND, lower extremity edema, dizziness, presyncope, syncope, snoring, daytime somnolence, bleeding, or neurologic sequela. The patient is tolerating medications without difficulties and is otherwise without complaint today.    Atrial Fibrillation Risk Factors:  she does not have symptoms or diagnosis of sleep apnea. she does not have a history of rheumatic fever.   she has a BMI of Body mass index is 32.86 kg/m.Marland Kitchen Filed Weights   03/24/21 1037  Weight: 92.4 kg    No family history on file.   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   Hyperlipemia    Hypertension    PONV (postoperative nausea and vomiting)    scop patch before prior surgeries   Past Surgical History:  Procedure Laterality Date   ABDOMINAL HYSTERECTOMY  04/19/1993   APPENDECTOMY  04/19/1972   CHOLECYSTECTOMY  04/19/1994   COLONOSCOPY     JOINT REPLACEMENT     lft knee 2005, rt knee  2006   KNEE ARTHROSCOPY     spacer on lft knee   LUMBAR LAMINECTOMY/DECOMPRESSION MICRODISCECTOMY Left 03/09/2021   Procedure: Laminectomy and Foraminotomy - left - Lumbar five-Sacral one;  Surgeon: Tia Alert, MD;  Location: [facility] OR;  Service: Neurosurgery;  Laterality: Left;   RECTAL SURGERY     fissure   TONSILLECTOMY     TOTAL KNEE REVISION  08/17/2011   Procedure: TOTAL KNEE REVISION;  Surgeon: Valeria Batman, MD;  Location: [facility] OR;  Service: Orthopedics;  Laterality: Left;  left total knee revision with removal of antibiotic spacer    Current Outpatient Medications  Medication Sig Dispense Refill   Calcium Carbonate (CALCIUM 600 PO) Take 600 mg by mouth daily.     celecoxib (CELEBREX) 200 MG capsule Take 200 mg by mouth 2 (two) times daily.     Cholecalciferol (VITAMIN D3) 125 MCG (5000 UT) CAPS Take 5,000 Units by mouth daily.     docusate sodium (COLACE) 100 MG capsule Take 100 mg by mouth daily as needed for moderate constipation.     gabapentin (NEURONTIN) 600 MG tablet Take 600 mg by mouth 3 (three) times daily.     HYDROcodone-acetaminophen (NORCO) 7.5-325 MG tablet Take 1 tablet by mouth every 6 (six) hours. 30 tablet 0   nebivolol (BYSTOLIC) 5 MG tablet Take 5 mg by mouth daily.     oxybutynin (DITROPAN-XL) 10 MG 24 hr tablet Take 10 mg by mouth at bedtime.     No current facility-administered medications for this encounter.    No Known Allergies  Social History  Socioeconomic History   Marital status: Married    Spouse name: Not on file   Number of children: Not on file   Years of education: Not on file   Highest education level: Not on file  Occupational History   Not on file  Tobacco Use   Smoking status: Never   Smokeless tobacco: Never  Vaping Use   Vaping Use: Never used  Substance and Sexual Activity   Alcohol use: No   Drug use: No   Sexual activity: Yes    Comment: hysterectomy  Other Topics Concern   Not on file  Social History Narrative    Not on file   Social Determinants of Health   Financial Resource Strain: Not on file  Food Insecurity: Not on file  Transportation Needs: Not on file  Physical Activity: Not on file  Stress: Not on file  Social Connections: Not on file  Intimate Partner Violence: Not on file     ROS- All systems are reviewed and negative except as per the HPI above.  Physical Exam: Vitals:   03/24/21 1037  BP: 114/78  Pulse: 63  Weight: 92.4 kg  Height: 5\' 6"  (1.676 m)    GEN- The patient is a well appearing obese elderly female, alert and oriented x 3 today.   Head- normocephalic, atraumatic Eyes-  Sclera clear, conjunctiva pink Ears- hearing intact Oropharynx- clear Neck- supple  Lungs- Clear to ausculation bilaterally, normal work of breathing Heart- Regular rate and rhythm, no murmurs, rubs or gallops  GI- soft, NT, ND, + BS Extremities- no clubbing, cyanosis, or edema MS- no significant deformity or atrophy Skin- no rash or lesion Psych- euthymic mood, full affect Neuro- strength and sensation are intact  Wt Readings from Last 3 Encounters:  03/24/21 92.4 kg  03/09/21 91.6 kg  03/05/21 91.6 kg    EKG today demonstrates  SR Vent. rate 63 BPM PR interval 140 ms QRS duration 72 ms QT/QTcB 388/397 ms  Echo 03/10/21 demonstrated   1. Left ventricular ejection fraction, by estimation, is 60 to 65%. The left ventricle has normal function. The left ventricle has no regional wall motion abnormalities. Left ventricular diastolic parameters are consistent with Grade II diastolic  dysfunction (pseudonormalization).   2. Right ventricular systolic function is normal. The right ventricular  size is normal. There is normal pulmonary artery systolic pressure. The estimated right ventricular systolic pressure is 99991111 mmHg.   3. Left atrial size was mildly dilated.   4. Right atrial size was mildly dilated.   5. The mitral valve is normal in structure. Trivial mitral valve   regurgitation. No evidence of mitral stenosis.   6. The aortic valve is tricuspid. Aortic valve regurgitation is trivial.   7. The inferior vena cava is dilated in size with >50% respiratory  variability, suggesting right atrial pressure of 8 mmHg.   Epic records are reviewed at length today  CHA2DS2-VASc Score = 4  The patient's score is based upon: CHF History: 0 HTN History: 1 Diabetes History: 0 Stroke History: 0 Vascular Disease History: 0 Age Score: 2 Gender Score: 1      ASSESSMENT AND PLAN: 1. Postoperative atrial fibrillation  The patient's CHA2DS2-VASc score is 4, indicating a 4.8% annual risk of stroke.   Suspect this was an isolated event. Will see what monitor shows. If afib seen on monitor, anticoagulation would be indicated.   2. Obesity Body mass index is 32.86 kg/m. Lifestyle modification was discussed at length including regular exercise  and weight reduction.  3. HTN Stable, no changes today.   Follow up in the AF clinic in 4 weeks to discuss monitor results.    [facility] [address] [place], [place] [address] [phone] 03/24/2021 12:12 PM

## 2021-04-03 ENCOUNTER — Telehealth: Payer: Self-pay | Admitting: *Deleted

## 2021-04-03 MED ORDER — APIXABAN 5 MG PO TABS: 5.0000 mg | ORAL_TABLET | Freq: Two times a day (BID) | ORAL | 2 refills | Status: AC

## 2021-04-03 NOTE — Telephone Encounter (Signed)
-----  Message from Burnell Blanks, MD sent at 04/02/2021  3:47 PM EST ----- [name], Atrial fibrillation on cardiac monitor. I met her in the hospital following a neurosurgery procedure with rapid atrial fib just after she came out of the OR. She converted to sinus. She was not discharged on anti-coagulation. Follow up in atrial fib clinic last week and doing well but she was still wearing her monitor.   Now that there is atrial fib on the monitor, she will need to be started on Eliquis 5 mg po BID. She has a follow up in the atrial fib clinic with Adline Peals 04/23/21.   [place]

## 2021-04-03 NOTE — Telephone Encounter (Signed)
I reviewed information with the patient who asked me to review this information with her husband.  I reviewed all the information with Mr. Bains.  He voices understanding.  He asked me to send in the Eliquis but he wants his PCP to review and get his opinion as well.   I have forwarded the monitor report to Dr. Debroah Loop and sent prescription for Eliquis to their pharmacy.

## 2021-04-23 ENCOUNTER — Ambulatory Visit (HOSPITAL_COMMUNITY): Payer: Medicare Other | Admitting: Physician Assistant

## 2023-06-05 IMAGING — CR DG LUMBAR SPINE 2-3V
2 series · 2 of 2 positions shown · non-contrast
Comparison: X-ray lumbar spine 02/19/2021

CLINICAL DATA: Intraoperative imaging for localization

EXAM:
LUMBAR SPINE - 2-3 VIEW

[lateral (1 of 2)]
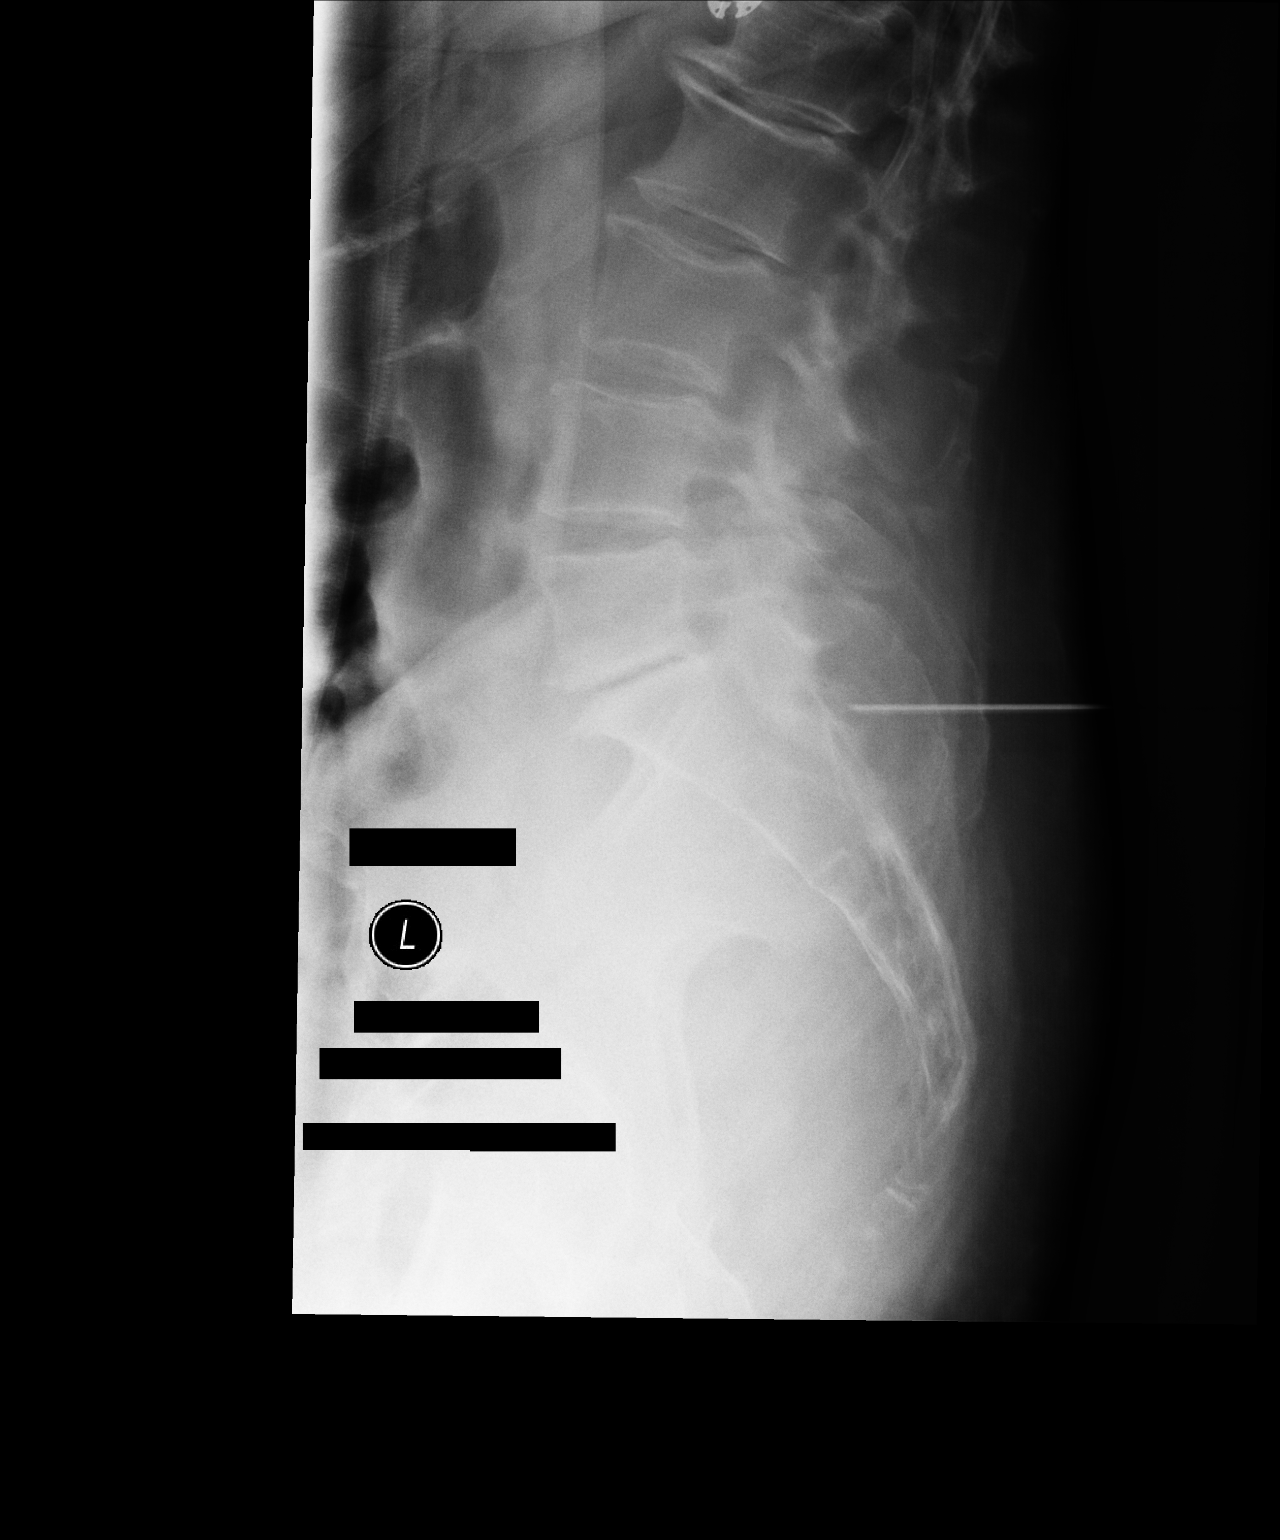

[lateral (2 of 2)]
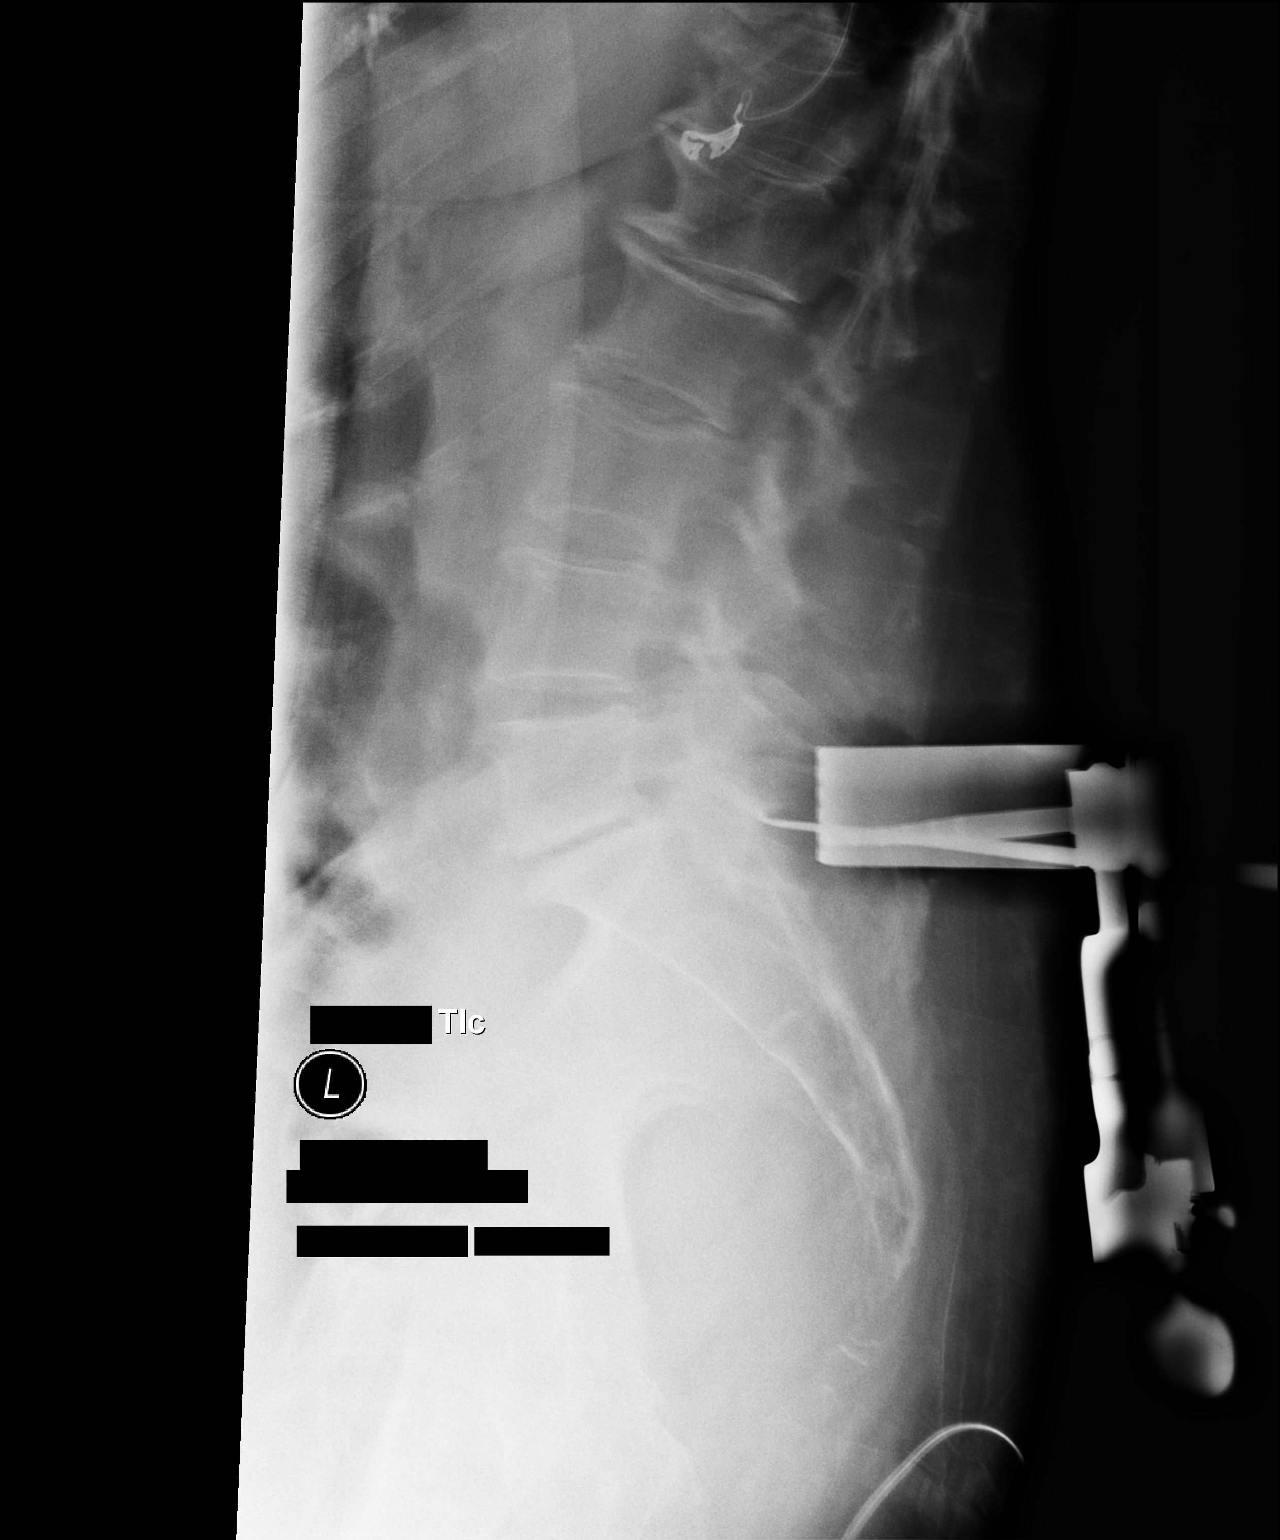

[2 of 2 positions shown; findings below may reference images not displayed]

FINDINGS: Intraoperative spine surgery with surgical instrument posterior to
the lower S1 level.
IMPRESSION: Intraoperative spine surgery with surgical instrument posterior to
the lower S1 level.
